# Patient Record
Sex: Male | Born: 1940
Health system: Southern US, Community
[De-identification: ages and names within clinical notes are randomized; demographics above are authoritative.]

## PROBLEM LIST (undated history)

## (undated) DIAGNOSIS — N289 Disorder of kidney and ureter, unspecified: Secondary | ICD-10-CM

## (undated) DIAGNOSIS — M1990 Unspecified osteoarthritis, unspecified site: Secondary | ICD-10-CM

## (undated) HISTORY — PX: JOINT REPLACEMENT: SHX530

---

## 2010-05-03 ENCOUNTER — Ambulatory Visit: Payer: Self-pay | Admitting: Unknown Physician Specialty

## 2010-05-31 DIAGNOSIS — I10 Essential (primary) hypertension: Secondary | ICD-10-CM | POA: Insufficient documentation

## 2010-06-03 DIAGNOSIS — D355 Benign neoplasm of carotid body: Secondary | ICD-10-CM | POA: Insufficient documentation

## 2010-09-19 DIAGNOSIS — C44519 Basal cell carcinoma of skin of other part of trunk: Secondary | ICD-10-CM | POA: Insufficient documentation

## 2011-06-09 DIAGNOSIS — D164 Benign neoplasm of bones of skull and face: Secondary | ICD-10-CM | POA: Insufficient documentation

## 2013-01-24 DIAGNOSIS — D447 Neoplasm of uncertain behavior of aortic body and other paraganglia: Secondary | ICD-10-CM | POA: Insufficient documentation

## 2013-06-06 DIAGNOSIS — E669 Obesity, unspecified: Secondary | ICD-10-CM | POA: Insufficient documentation

## 2013-06-06 DIAGNOSIS — E663 Overweight: Secondary | ICD-10-CM | POA: Insufficient documentation

## 2013-06-13 ENCOUNTER — Ambulatory Visit: Payer: Self-pay | Admitting: Unknown Physician Specialty

## 2013-07-30 DIAGNOSIS — G4733 Obstructive sleep apnea (adult) (pediatric): Secondary | ICD-10-CM | POA: Insufficient documentation

## 2013-09-08 DIAGNOSIS — M1710 Unilateral primary osteoarthritis, unspecified knee: Secondary | ICD-10-CM | POA: Insufficient documentation

## 2013-09-08 DIAGNOSIS — M179 Osteoarthritis of knee, unspecified: Secondary | ICD-10-CM | POA: Insufficient documentation

## 2013-11-01 DIAGNOSIS — Z91018 Allergy to other foods: Secondary | ICD-10-CM | POA: Insufficient documentation

## 2013-11-01 DIAGNOSIS — J309 Allergic rhinitis, unspecified: Secondary | ICD-10-CM | POA: Insufficient documentation

## 2013-12-27 DIAGNOSIS — M7540 Impingement syndrome of unspecified shoulder: Secondary | ICD-10-CM | POA: Insufficient documentation

## 2014-01-20 ENCOUNTER — Ambulatory Visit: Payer: Self-pay | Admitting: Unknown Physician Specialty

## 2014-05-30 DIAGNOSIS — Z96652 Presence of left artificial knee joint: Secondary | ICD-10-CM | POA: Insufficient documentation

## 2015-05-18 DIAGNOSIS — X32XXXA Exposure to sunlight, initial encounter: Secondary | ICD-10-CM | POA: Diagnosis not present

## 2015-05-18 DIAGNOSIS — L570 Actinic keratosis: Secondary | ICD-10-CM | POA: Diagnosis not present

## 2015-06-15 DIAGNOSIS — D225 Melanocytic nevi of trunk: Secondary | ICD-10-CM | POA: Diagnosis not present

## 2015-06-15 DIAGNOSIS — Z85820 Personal history of malignant melanoma of skin: Secondary | ICD-10-CM | POA: Diagnosis not present

## 2015-06-15 DIAGNOSIS — D2271 Melanocytic nevi of right lower limb, including hip: Secondary | ICD-10-CM | POA: Diagnosis not present

## 2015-06-15 DIAGNOSIS — X32XXXA Exposure to sunlight, initial encounter: Secondary | ICD-10-CM | POA: Diagnosis not present

## 2015-06-15 DIAGNOSIS — L570 Actinic keratosis: Secondary | ICD-10-CM | POA: Diagnosis not present

## 2015-06-15 DIAGNOSIS — Z85828 Personal history of other malignant neoplasm of skin: Secondary | ICD-10-CM | POA: Diagnosis not present

## 2015-09-19 DIAGNOSIS — N183 Chronic kidney disease, stage 3 (moderate): Secondary | ICD-10-CM | POA: Diagnosis not present

## 2015-09-19 DIAGNOSIS — E7800 Pure hypercholesterolemia, unspecified: Secondary | ICD-10-CM | POA: Diagnosis not present

## 2015-09-19 DIAGNOSIS — Z79899 Other long term (current) drug therapy: Secondary | ICD-10-CM | POA: Diagnosis not present

## 2015-09-19 DIAGNOSIS — Z1211 Encounter for screening for malignant neoplasm of colon: Secondary | ICD-10-CM | POA: Diagnosis not present

## 2015-09-19 DIAGNOSIS — I10 Essential (primary) hypertension: Secondary | ICD-10-CM | POA: Diagnosis not present

## 2015-09-19 DIAGNOSIS — E663 Overweight: Secondary | ICD-10-CM | POA: Diagnosis not present

## 2015-09-19 DIAGNOSIS — Z125 Encounter for screening for malignant neoplasm of prostate: Secondary | ICD-10-CM | POA: Diagnosis not present

## 2015-09-19 DIAGNOSIS — Z0000 Encounter for general adult medical examination without abnormal findings: Secondary | ICD-10-CM | POA: Diagnosis not present

## 2015-10-08 DIAGNOSIS — D1809 Hemangioma of other sites: Secondary | ICD-10-CM | POA: Diagnosis not present

## 2015-10-08 DIAGNOSIS — D1802 Hemangioma of intracranial structures: Secondary | ICD-10-CM | POA: Diagnosis not present

## 2015-10-08 DIAGNOSIS — D447 Neoplasm of uncertain behavior of aortic body and other paraganglia: Secondary | ICD-10-CM | POA: Diagnosis not present

## 2015-10-09 DIAGNOSIS — X32XXXA Exposure to sunlight, initial encounter: Secondary | ICD-10-CM | POA: Diagnosis not present

## 2015-10-09 DIAGNOSIS — D2271 Melanocytic nevi of right lower limb, including hip: Secondary | ICD-10-CM | POA: Diagnosis not present

## 2015-10-09 DIAGNOSIS — D225 Melanocytic nevi of trunk: Secondary | ICD-10-CM | POA: Diagnosis not present

## 2015-10-09 DIAGNOSIS — Z85828 Personal history of other malignant neoplasm of skin: Secondary | ICD-10-CM | POA: Diagnosis not present

## 2015-10-09 DIAGNOSIS — Z85820 Personal history of malignant melanoma of skin: Secondary | ICD-10-CM | POA: Diagnosis not present

## 2015-10-09 DIAGNOSIS — L570 Actinic keratosis: Secondary | ICD-10-CM | POA: Diagnosis not present

## 2015-11-15 DIAGNOSIS — N183 Chronic kidney disease, stage 3 unspecified: Secondary | ICD-10-CM | POA: Insufficient documentation

## 2015-12-07 DIAGNOSIS — N281 Cyst of kidney, acquired: Secondary | ICD-10-CM | POA: Diagnosis not present

## 2015-12-07 DIAGNOSIS — N183 Chronic kidney disease, stage 3 (moderate): Secondary | ICD-10-CM | POA: Diagnosis not present

## 2016-02-22 DIAGNOSIS — Z86010 Personal history of colonic polyps: Secondary | ICD-10-CM | POA: Diagnosis not present

## 2016-02-22 DIAGNOSIS — D369 Benign neoplasm, unspecified site: Secondary | ICD-10-CM | POA: Diagnosis not present

## 2016-02-22 DIAGNOSIS — D125 Benign neoplasm of sigmoid colon: Secondary | ICD-10-CM | POA: Diagnosis not present

## 2016-02-22 DIAGNOSIS — K5730 Diverticulosis of large intestine without perforation or abscess without bleeding: Secondary | ICD-10-CM | POA: Diagnosis not present

## 2016-02-22 DIAGNOSIS — G4733 Obstructive sleep apnea (adult) (pediatric): Secondary | ICD-10-CM | POA: Diagnosis not present

## 2016-02-22 DIAGNOSIS — Z85828 Personal history of other malignant neoplasm of skin: Secondary | ICD-10-CM | POA: Diagnosis not present

## 2016-02-22 DIAGNOSIS — Z1211 Encounter for screening for malignant neoplasm of colon: Secondary | ICD-10-CM | POA: Diagnosis not present

## 2016-03-06 DIAGNOSIS — Z85820 Personal history of malignant melanoma of skin: Secondary | ICD-10-CM | POA: Diagnosis not present

## 2016-03-06 DIAGNOSIS — D485 Neoplasm of uncertain behavior of skin: Secondary | ICD-10-CM | POA: Diagnosis not present

## 2016-03-06 DIAGNOSIS — L570 Actinic keratosis: Secondary | ICD-10-CM | POA: Diagnosis not present

## 2016-03-06 DIAGNOSIS — X32XXXA Exposure to sunlight, initial encounter: Secondary | ICD-10-CM | POA: Diagnosis not present

## 2016-03-06 DIAGNOSIS — C44329 Squamous cell carcinoma of skin of other parts of face: Secondary | ICD-10-CM | POA: Diagnosis not present

## 2016-03-06 DIAGNOSIS — Z85828 Personal history of other malignant neoplasm of skin: Secondary | ICD-10-CM | POA: Diagnosis not present

## 2016-03-06 DIAGNOSIS — D225 Melanocytic nevi of trunk: Secondary | ICD-10-CM | POA: Diagnosis not present

## 2016-03-06 DIAGNOSIS — D2271 Melanocytic nevi of right lower limb, including hip: Secondary | ICD-10-CM | POA: Diagnosis not present

## 2016-05-21 DIAGNOSIS — L905 Scar conditions and fibrosis of skin: Secondary | ICD-10-CM | POA: Diagnosis not present

## 2016-05-21 DIAGNOSIS — C44329 Squamous cell carcinoma of skin of other parts of face: Secondary | ICD-10-CM | POA: Diagnosis not present

## 2016-05-22 DIAGNOSIS — N183 Chronic kidney disease, stage 3 (moderate): Secondary | ICD-10-CM | POA: Diagnosis not present

## 2016-05-22 DIAGNOSIS — I10 Essential (primary) hypertension: Secondary | ICD-10-CM | POA: Diagnosis not present

## 2016-05-26 DIAGNOSIS — H2513 Age-related nuclear cataract, bilateral: Secondary | ICD-10-CM | POA: Diagnosis not present

## 2016-05-26 DIAGNOSIS — H524 Presbyopia: Secondary | ICD-10-CM | POA: Diagnosis not present

## 2016-05-26 DIAGNOSIS — H11153 Pinguecula, bilateral: Secondary | ICD-10-CM | POA: Diagnosis not present

## 2016-06-20 DIAGNOSIS — I10 Essential (primary) hypertension: Secondary | ICD-10-CM | POA: Diagnosis not present

## 2016-06-20 DIAGNOSIS — G4730 Sleep apnea, unspecified: Secondary | ICD-10-CM | POA: Diagnosis not present

## 2016-06-20 DIAGNOSIS — Z79899 Other long term (current) drug therapy: Secondary | ICD-10-CM | POA: Diagnosis not present

## 2016-06-20 DIAGNOSIS — Z136 Encounter for screening for cardiovascular disorders: Secondary | ICD-10-CM | POA: Diagnosis not present

## 2016-06-20 DIAGNOSIS — N181 Chronic kidney disease, stage 1: Secondary | ICD-10-CM | POA: Diagnosis not present

## 2016-06-20 DIAGNOSIS — Z125 Encounter for screening for malignant neoplasm of prostate: Secondary | ICD-10-CM | POA: Diagnosis not present

## 2016-06-20 DIAGNOSIS — Z0000 Encounter for general adult medical examination without abnormal findings: Secondary | ICD-10-CM | POA: Diagnosis not present

## 2016-06-20 DIAGNOSIS — I517 Cardiomegaly: Secondary | ICD-10-CM | POA: Diagnosis not present

## 2016-06-20 DIAGNOSIS — R001 Bradycardia, unspecified: Secondary | ICD-10-CM | POA: Diagnosis not present

## 2016-06-20 DIAGNOSIS — R739 Hyperglycemia, unspecified: Secondary | ICD-10-CM | POA: Diagnosis not present

## 2016-06-20 DIAGNOSIS — R7303 Prediabetes: Secondary | ICD-10-CM | POA: Diagnosis not present

## 2016-06-20 DIAGNOSIS — E7800 Pure hypercholesterolemia, unspecified: Secondary | ICD-10-CM | POA: Diagnosis not present

## 2016-08-20 DIAGNOSIS — D225 Melanocytic nevi of trunk: Secondary | ICD-10-CM | POA: Diagnosis not present

## 2016-08-20 DIAGNOSIS — X32XXXA Exposure to sunlight, initial encounter: Secondary | ICD-10-CM | POA: Diagnosis not present

## 2016-08-20 DIAGNOSIS — L570 Actinic keratosis: Secondary | ICD-10-CM | POA: Diagnosis not present

## 2016-08-20 DIAGNOSIS — C44519 Basal cell carcinoma of skin of other part of trunk: Secondary | ICD-10-CM | POA: Diagnosis not present

## 2016-08-20 DIAGNOSIS — D2261 Melanocytic nevi of right upper limb, including shoulder: Secondary | ICD-10-CM | POA: Diagnosis not present

## 2016-08-20 DIAGNOSIS — Z85828 Personal history of other malignant neoplasm of skin: Secondary | ICD-10-CM | POA: Diagnosis not present

## 2016-08-20 DIAGNOSIS — D045 Carcinoma in situ of skin of trunk: Secondary | ICD-10-CM | POA: Diagnosis not present

## 2016-08-20 DIAGNOSIS — Z85820 Personal history of malignant melanoma of skin: Secondary | ICD-10-CM | POA: Diagnosis not present

## 2016-08-20 DIAGNOSIS — D485 Neoplasm of uncertain behavior of skin: Secondary | ICD-10-CM | POA: Diagnosis not present

## 2016-09-23 DIAGNOSIS — D045 Carcinoma in situ of skin of trunk: Secondary | ICD-10-CM | POA: Diagnosis not present

## 2016-09-23 DIAGNOSIS — C44519 Basal cell carcinoma of skin of other part of trunk: Secondary | ICD-10-CM | POA: Diagnosis not present

## 2016-11-20 DIAGNOSIS — N183 Chronic kidney disease, stage 3 (moderate): Secondary | ICD-10-CM | POA: Diagnosis not present

## 2017-01-06 DIAGNOSIS — Z85828 Personal history of other malignant neoplasm of skin: Secondary | ICD-10-CM | POA: Diagnosis not present

## 2017-01-06 DIAGNOSIS — L538 Other specified erythematous conditions: Secondary | ICD-10-CM | POA: Diagnosis not present

## 2017-01-06 DIAGNOSIS — L820 Inflamed seborrheic keratosis: Secondary | ICD-10-CM | POA: Diagnosis not present

## 2017-01-06 DIAGNOSIS — L570 Actinic keratosis: Secondary | ICD-10-CM | POA: Diagnosis not present

## 2017-01-06 DIAGNOSIS — D045 Carcinoma in situ of skin of trunk: Secondary | ICD-10-CM | POA: Diagnosis not present

## 2017-01-06 DIAGNOSIS — D225 Melanocytic nevi of trunk: Secondary | ICD-10-CM | POA: Diagnosis not present

## 2017-01-06 DIAGNOSIS — D2261 Melanocytic nevi of right upper limb, including shoulder: Secondary | ICD-10-CM | POA: Diagnosis not present

## 2017-01-06 DIAGNOSIS — X32XXXA Exposure to sunlight, initial encounter: Secondary | ICD-10-CM | POA: Diagnosis not present

## 2017-01-06 DIAGNOSIS — D485 Neoplasm of uncertain behavior of skin: Secondary | ICD-10-CM | POA: Diagnosis not present

## 2017-01-06 DIAGNOSIS — Z85820 Personal history of malignant melanoma of skin: Secondary | ICD-10-CM | POA: Diagnosis not present

## 2017-03-06 DIAGNOSIS — D045 Carcinoma in situ of skin of trunk: Secondary | ICD-10-CM | POA: Diagnosis not present

## 2017-03-06 DIAGNOSIS — L910 Hypertrophic scar: Secondary | ICD-10-CM | POA: Diagnosis not present

## 2017-06-11 DIAGNOSIS — C44712 Basal cell carcinoma of skin of right lower limb, including hip: Secondary | ICD-10-CM | POA: Diagnosis not present

## 2017-06-11 DIAGNOSIS — Z09 Encounter for follow-up examination after completed treatment for conditions other than malignant neoplasm: Secondary | ICD-10-CM | POA: Diagnosis not present

## 2017-06-11 DIAGNOSIS — Z85828 Personal history of other malignant neoplasm of skin: Secondary | ICD-10-CM | POA: Diagnosis not present

## 2017-06-11 DIAGNOSIS — Z08 Encounter for follow-up examination after completed treatment for malignant neoplasm: Secondary | ICD-10-CM | POA: Diagnosis not present

## 2017-06-11 DIAGNOSIS — X32XXXA Exposure to sunlight, initial encounter: Secondary | ICD-10-CM | POA: Diagnosis not present

## 2017-06-11 DIAGNOSIS — L570 Actinic keratosis: Secondary | ICD-10-CM | POA: Diagnosis not present

## 2017-06-11 DIAGNOSIS — C44519 Basal cell carcinoma of skin of other part of trunk: Secondary | ICD-10-CM | POA: Diagnosis not present

## 2017-06-11 DIAGNOSIS — Z85820 Personal history of malignant melanoma of skin: Secondary | ICD-10-CM | POA: Diagnosis not present

## 2017-06-11 DIAGNOSIS — D485 Neoplasm of uncertain behavior of skin: Secondary | ICD-10-CM | POA: Diagnosis not present

## 2017-07-29 DIAGNOSIS — C44712 Basal cell carcinoma of skin of right lower limb, including hip: Secondary | ICD-10-CM | POA: Diagnosis not present

## 2017-07-29 DIAGNOSIS — L905 Scar conditions and fibrosis of skin: Secondary | ICD-10-CM | POA: Diagnosis not present

## 2017-07-29 DIAGNOSIS — C44519 Basal cell carcinoma of skin of other part of trunk: Secondary | ICD-10-CM | POA: Diagnosis not present

## 2017-07-30 DIAGNOSIS — N183 Chronic kidney disease, stage 3 (moderate): Secondary | ICD-10-CM | POA: Diagnosis not present

## 2017-07-30 DIAGNOSIS — D631 Anemia in chronic kidney disease: Secondary | ICD-10-CM | POA: Diagnosis not present

## 2017-08-19 DIAGNOSIS — J029 Acute pharyngitis, unspecified: Secondary | ICD-10-CM | POA: Diagnosis not present

## 2017-08-19 DIAGNOSIS — J069 Acute upper respiratory infection, unspecified: Secondary | ICD-10-CM | POA: Diagnosis not present

## 2017-08-31 DIAGNOSIS — I10 Essential (primary) hypertension: Secondary | ICD-10-CM | POA: Diagnosis not present

## 2017-08-31 DIAGNOSIS — N182 Chronic kidney disease, stage 2 (mild): Secondary | ICD-10-CM | POA: Diagnosis not present

## 2017-08-31 DIAGNOSIS — I129 Hypertensive chronic kidney disease with stage 1 through stage 4 chronic kidney disease, or unspecified chronic kidney disease: Secondary | ICD-10-CM | POA: Diagnosis not present

## 2017-08-31 DIAGNOSIS — G4730 Sleep apnea, unspecified: Secondary | ICD-10-CM | POA: Diagnosis not present

## 2017-08-31 DIAGNOSIS — E663 Overweight: Secondary | ICD-10-CM | POA: Diagnosis not present

## 2017-08-31 DIAGNOSIS — E786 Lipoprotein deficiency: Secondary | ICD-10-CM | POA: Diagnosis not present

## 2017-08-31 DIAGNOSIS — Z79899 Other long term (current) drug therapy: Secondary | ICD-10-CM | POA: Diagnosis not present

## 2017-08-31 DIAGNOSIS — Z125 Encounter for screening for malignant neoplasm of prostate: Secondary | ICD-10-CM | POA: Diagnosis not present

## 2017-08-31 DIAGNOSIS — Z0000 Encounter for general adult medical examination without abnormal findings: Secondary | ICD-10-CM | POA: Diagnosis not present

## 2017-08-31 DIAGNOSIS — E785 Hyperlipidemia, unspecified: Secondary | ICD-10-CM | POA: Diagnosis not present

## 2017-09-09 DIAGNOSIS — S61210A Laceration without foreign body of right index finger without damage to nail, initial encounter: Secondary | ICD-10-CM | POA: Diagnosis not present

## 2017-10-28 DIAGNOSIS — X32XXXA Exposure to sunlight, initial encounter: Secondary | ICD-10-CM | POA: Diagnosis not present

## 2017-10-28 DIAGNOSIS — Z08 Encounter for follow-up examination after completed treatment for malignant neoplasm: Secondary | ICD-10-CM | POA: Diagnosis not present

## 2017-10-28 DIAGNOSIS — Z85828 Personal history of other malignant neoplasm of skin: Secondary | ICD-10-CM | POA: Diagnosis not present

## 2017-10-28 DIAGNOSIS — D2261 Melanocytic nevi of right upper limb, including shoulder: Secondary | ICD-10-CM | POA: Diagnosis not present

## 2017-10-28 DIAGNOSIS — Z09 Encounter for follow-up examination after completed treatment for conditions other than malignant neoplasm: Secondary | ICD-10-CM | POA: Diagnosis not present

## 2017-10-28 DIAGNOSIS — Z85820 Personal history of malignant melanoma of skin: Secondary | ICD-10-CM | POA: Diagnosis not present

## 2017-10-28 DIAGNOSIS — L570 Actinic keratosis: Secondary | ICD-10-CM | POA: Diagnosis not present

## 2017-10-28 DIAGNOSIS — Z872 Personal history of diseases of the skin and subcutaneous tissue: Secondary | ICD-10-CM | POA: Diagnosis not present

## 2017-10-28 DIAGNOSIS — D2262 Melanocytic nevi of left upper limb, including shoulder: Secondary | ICD-10-CM | POA: Diagnosis not present

## 2017-11-23 DIAGNOSIS — D1802 Hemangioma of intracranial structures: Secondary | ICD-10-CM | POA: Diagnosis not present

## 2017-11-23 DIAGNOSIS — D447 Neoplasm of uncertain behavior of aortic body and other paraganglia: Secondary | ICD-10-CM | POA: Diagnosis not present

## 2018-01-05 ENCOUNTER — Emergency Department: Payer: PPO

## 2018-01-05 ENCOUNTER — Emergency Department
Admission: EM | Admit: 2018-01-05 | Discharge: 2018-01-05 | Disposition: A | Discharge status: Home / Self Care | Payer: PPO | Attending: Emergency Medicine | Admitting: Emergency Medicine

## 2018-01-05 ENCOUNTER — Encounter: Payer: Self-pay | Admitting: Emergency Medicine

## 2018-01-05 DIAGNOSIS — R509 Fever, unspecified: Secondary | ICD-10-CM | POA: Diagnosis present

## 2018-01-05 DIAGNOSIS — R05 Cough: Secondary | ICD-10-CM | POA: Diagnosis not present

## 2018-01-05 DIAGNOSIS — J189 Pneumonia, unspecified organism: Secondary | ICD-10-CM | POA: Diagnosis not present

## 2018-01-05 HISTORY — DX: Disorder of kidney and ureter, unspecified: N28.9

## 2018-01-05 HISTORY — DX: Unspecified osteoarthritis, unspecified site: M19.90

## 2018-01-05 LAB — CBC WITH DIFFERENTIAL/PLATELET
BASOS PCT: 1 %
Basophils Absolute: 0 10*3/uL (ref 0–0.1)
Eosinophils Absolute: 0 10*3/uL (ref 0–0.7)
Eosinophils Relative: 0 %
HCT: 38 % — ABNORMAL LOW (ref 40.0–52.0)
HEMOGLOBIN: 13.1 g/dL (ref 13.0–18.0)
LYMPHS ABS: 0.5 10*3/uL — AB (ref 1.0–3.6)
LYMPHS PCT: 6 %
MCH: 32.8 pg (ref 26.0–34.0)
MCHC: 34.3 g/dL (ref 32.0–36.0)
MCV: 95.5 fL (ref 80.0–100.0)
MONO ABS: 0.6 10*3/uL (ref 0.2–1.0)
Monocytes Relative: 7 %
NEUTROS PCT: 86 %
Neutro Abs: 8 10*3/uL — ABNORMAL HIGH (ref 1.4–6.5)
Platelets: 143 10*3/uL — ABNORMAL LOW (ref 150–440)
RBC: 3.98 MIL/uL — ABNORMAL LOW (ref 4.40–5.90)
RDW: 13.3 % (ref 11.5–14.5)
WBC: 9.2 10*3/uL (ref 3.8–10.6)

## 2018-01-05 LAB — COMPREHENSIVE METABOLIC PANEL
ALBUMIN: 4 g/dL (ref 3.5–5.0)
ALK PHOS: 106 U/L (ref 38–126)
ALT: 124 U/L — ABNORMAL HIGH (ref 0–44)
ANION GAP: 10 (ref 5–15)
AST: 137 U/L — ABNORMAL HIGH (ref 15–41)
BILIRUBIN TOTAL: 0.8 mg/dL (ref 0.3–1.2)
BUN: 26 mg/dL — ABNORMAL HIGH (ref 8–23)
CALCIUM: 9.1 mg/dL (ref 8.9–10.3)
CO2: 24 mmol/L (ref 22–32)
Chloride: 103 mmol/L (ref 98–111)
Creatinine, Ser: 1.9 mg/dL — ABNORMAL HIGH (ref 0.61–1.24)
GFR calc Af Amer: 38 mL/min — ABNORMAL LOW (ref >60)
GFR, EST NON AFRICAN AMERICAN: 33 mL/min — AB (ref >60)
GLUCOSE: 140 mg/dL — AB (ref 70–99)
Potassium: 3.5 mmol/L (ref 3.5–5.1)
Sodium: 137 mmol/L (ref 135–145)
Total Protein: 7 g/dL (ref 6.5–8.1)

## 2018-01-05 MED ORDER — CEPHALEXIN 250 MG PO CAPS: 250.0000 mg | ORAL_CAPSULE | Freq: Three times a day (TID) | ORAL | 0 refills | Status: AC

## 2018-01-05 MED ORDER — ACETAMINOPHEN 500 MG PO TABS
1000.0000 mg | ORAL_TABLET | Freq: Once | ORAL | Status: AC
  Administered 2018-01-05: 1000 mg via ORAL

## 2018-01-05 MED ORDER — ACETAMINOPHEN 500 MG PO TABS
ORAL_TABLET | ORAL | Status: AC
  Filled 2018-01-05: qty 2

## 2018-01-05 MED ORDER — SODIUM CHLORIDE 0.9 % IV BOLUS
1000.0000 mL | Freq: Once | INTRAVENOUS | Status: AC
  Administered 2018-01-05: 1000 mL via INTRAVENOUS

## 2018-01-05 MED ORDER — AZITHROMYCIN 500 MG PO TABS
500.0000 mg | ORAL_TABLET | Freq: Once | ORAL | Status: AC
  Administered 2018-01-05: 500 mg via ORAL
  Filled 2018-01-05: qty 1

## 2018-01-05 MED ORDER — SODIUM CHLORIDE 0.9 % IV SOLN
1.0000 g | Freq: Once | INTRAVENOUS | Status: AC
  Administered 2018-01-05: 1 g via INTRAVENOUS
  Filled 2018-01-05: qty 10

## 2018-01-05 MED ORDER — AZITHROMYCIN 250 MG PO TABS: 250.0000 mg | ORAL_TABLET | Freq: Every day | ORAL | 0 refills | Status: DC

## 2018-01-05 NOTE — ED Provider Notes (Signed)
[facility] Emergency Department Provider Note  Time seen: 5:39 PM  I have reviewed the triage vital signs and the nursing notes.   HISTORY  Chief Complaint Fever    HPI Adrian Reyes is a 77 y.o. male with a past medical history of arthritis, CKD, presents to the emergency department for weakness chills fever and cough.  According to the patient for the past 2 days he has been coughing with generalized fatigue and weakness.  States he has been using Tylenol every 6 hours for fever.  States today the chills has significantly worsened when he came to the emergency department for evaluation.  Denies any chest pain.  Denies abdominal pain does state intermittent loose stool.  Denies any vomiting.  Denies any dysuria or hematuria.   Past Medical History:  Diagnosis Date  . Arthritis   . Renal disorder     There are no active problems to display for this patient.   Past Surgical History:  Procedure Laterality Date  . JOINT REPLACEMENT      Prior to Admission medications   Not on File    Not on File  No family history on file.  Social History Social History   Tobacco Use  . Smoking status: Never Smoker  . Smokeless tobacco: Never Used  Substance Use Topics  . Alcohol use: Yes  . Drug use: Not on file    Review of Systems Constitutional: Positive for fever and chills.  Positive for weakness. ENT: Negative for recent illness/congestion Cardiovascular: Negative for chest pain. Respiratory: Negative for shortness of breath.  Frequent cough. Gastrointestinal: Negative for abdominal pain, vomiting Genitourinary: Negative for urinary compaints Musculoskeletal: Negative for leg pain or swelling. Skin: Negative for skin complaints  Neurological: Negative for headache All other ROS negative  ____________________________________________   PHYSICAL EXAM:  VITAL SIGNS: ED Triage Vitals  Enc Vitals Group     BP 01/05/18 1600 (!) 108/55     Pulse Rate 01/05/18 1600 72     Resp 01/05/18 1600 18     Temp 01/05/18 1600 (!) 101.6 F (38.7 C)     Temp Source 01/05/18 1600 Oral     SpO2 01/05/18 1600 95 %     Weight 01/05/18 1603 220 lb (99.8 kg)     Height 01/05/18 1603 5\' 9"  (1.753 m)     Head Circumference --      Peak Flow --      Pain Score 01/05/18 1601 0     Pain Loc --      Pain Edu? --      Excl. in [place]? --     Constitutional: Alert and oriented. Well appearing and in no distress. Eyes: Normal exam ENT   Head: Normocephalic and atraumatic.   Mouth/Throat: Mucous membranes are moist. Cardiovascular: Normal rate, regular rhythm. No murmur Respiratory: Normal respiratory effort without tachypnea nor retractions. Breath sounds are clear, but frequent cough with deep inspiration attempts. Gastrointestinal: Soft and nontender. No distention.   Musculoskeletal: Nontender with normal range of motion in all extremities.  Neurologic:  Normal speech and language. No gross focal neurologic deficits are appreciated. Skin:  Skin is warm, dry and intact.  Psychiatric: Mood and affect are normal.  ____________________________________________   RADIOLOGY  Chest x-ray shows lingular opacity consistent with pneumonia.  ____________________________________________   INITIAL IMPRESSION / ASSESSMENT AND PLAN / ED COURSE  Pertinent labs & imaging results that were available during my care of the patient were reviewed by  me and considered in my medical decision making (see chart for details).  Patient presents emergency department for fever cough weakness and chills.  Differential would include upper respiratory infection, pneumonia, other infectious etiology, influenza.  We will check labs, chest x-ray and continue to closely monitor.  Chest x-ray shows lingular pneumonia, labs are largely at baseline as for his kidney dysfunction however patient does have mildly elevated LFTs.  Has a completely benign abdominal exam,  nontender.  Given the patient's cough with pneumonia confirmed on chest x-ray I highly believe this to be the cause of the patient's fever.  We will treat with antibiotics.  While the patient is in the emergency department we will dose IV fluids as well as IV antibiotics.  Patient's temperature currently 99.6 heart rate of 65 reassuring blood pressure and vitals satting between 96 to 98% on room air.  ____________________________________________   FINAL CLINICAL IMPRESSION(S) / ED DIAGNOSES  Pneumonia    Harvest Dark, MD 01/05/18 1743

## 2018-01-05 NOTE — ED Triage Notes (Signed)
Pt arrived with complaints of fever, cough, chills, and nausea.Pt denies pain stating, "I just feel weak." Pt denies any chest pain or shortness of breath. Pt states symptoms started Sunday night and has attempted OTC medication regimen. Last dosage of tylenol 1415

## 2018-01-05 NOTE — ED Notes (Signed)
Pt denies any burning or pain with urination, but states he has been urinating only small amounts at a time. Pt believes this is due to not drinking enough over the last few days.

## 2018-02-09 DIAGNOSIS — H903 Sensorineural hearing loss, bilateral: Secondary | ICD-10-CM | POA: Diagnosis not present

## 2018-02-12 DIAGNOSIS — M79671 Pain in right foot: Secondary | ICD-10-CM | POA: Diagnosis not present

## 2018-02-12 DIAGNOSIS — M2011 Hallux valgus (acquired), right foot: Secondary | ICD-10-CM | POA: Diagnosis not present

## 2018-03-11 DIAGNOSIS — N183 Chronic kidney disease, stage 3 (moderate): Secondary | ICD-10-CM | POA: Diagnosis not present

## 2018-03-11 DIAGNOSIS — H11153 Pinguecula, bilateral: Secondary | ICD-10-CM | POA: Diagnosis not present

## 2018-03-11 DIAGNOSIS — H25813 Combined forms of age-related cataract, bilateral: Secondary | ICD-10-CM | POA: Diagnosis not present

## 2018-03-11 DIAGNOSIS — H524 Presbyopia: Secondary | ICD-10-CM | POA: Diagnosis not present

## 2018-03-12 DIAGNOSIS — M2011 Hallux valgus (acquired), right foot: Secondary | ICD-10-CM | POA: Diagnosis not present

## 2018-03-15 DIAGNOSIS — H11153 Pinguecula, bilateral: Secondary | ICD-10-CM | POA: Diagnosis not present

## 2018-03-15 DIAGNOSIS — H25813 Combined forms of age-related cataract, bilateral: Secondary | ICD-10-CM | POA: Diagnosis not present

## 2018-03-15 DIAGNOSIS — H524 Presbyopia: Secondary | ICD-10-CM | POA: Diagnosis not present

## 2018-04-06 DIAGNOSIS — M24174 Other articular cartilage disorders, right foot: Secondary | ICD-10-CM | POA: Diagnosis not present

## 2018-04-06 DIAGNOSIS — M21611 Bunion of right foot: Secondary | ICD-10-CM | POA: Diagnosis not present

## 2018-04-06 DIAGNOSIS — S93141A Subluxation of metatarsophalangeal joint of right great toe, initial encounter: Secondary | ICD-10-CM | POA: Diagnosis not present

## 2018-04-06 DIAGNOSIS — M2011 Hallux valgus (acquired), right foot: Secondary | ICD-10-CM | POA: Diagnosis not present

## 2018-04-06 DIAGNOSIS — M216X1 Other acquired deformities of right foot: Secondary | ICD-10-CM | POA: Diagnosis not present

## 2018-04-06 DIAGNOSIS — S93144A Subluxation of metatarsophalangeal joint of right lesser toe(s), initial encounter: Secondary | ICD-10-CM | POA: Diagnosis not present

## 2018-04-06 DIAGNOSIS — M2041 Other hammer toe(s) (acquired), right foot: Secondary | ICD-10-CM | POA: Diagnosis not present

## 2018-04-06 DIAGNOSIS — G8918 Other acute postprocedural pain: Secondary | ICD-10-CM | POA: Diagnosis not present

## 2018-04-19 DIAGNOSIS — M2011 Hallux valgus (acquired), right foot: Secondary | ICD-10-CM | POA: Diagnosis not present

## 2018-05-06 DIAGNOSIS — D2261 Melanocytic nevi of right upper limb, including shoulder: Secondary | ICD-10-CM | POA: Diagnosis not present

## 2018-05-06 DIAGNOSIS — M67442 Ganglion, left hand: Secondary | ICD-10-CM | POA: Diagnosis not present

## 2018-05-06 DIAGNOSIS — D485 Neoplasm of uncertain behavior of skin: Secondary | ICD-10-CM | POA: Diagnosis not present

## 2018-05-06 DIAGNOSIS — L308 Other specified dermatitis: Secondary | ICD-10-CM | POA: Diagnosis not present

## 2018-05-06 DIAGNOSIS — X32XXXA Exposure to sunlight, initial encounter: Secondary | ICD-10-CM | POA: Diagnosis not present

## 2018-05-06 DIAGNOSIS — Z85820 Personal history of malignant melanoma of skin: Secondary | ICD-10-CM | POA: Diagnosis not present

## 2018-05-06 DIAGNOSIS — R202 Paresthesia of skin: Secondary | ICD-10-CM | POA: Diagnosis not present

## 2018-05-06 DIAGNOSIS — C44719 Basal cell carcinoma of skin of left lower limb, including hip: Secondary | ICD-10-CM | POA: Diagnosis not present

## 2018-05-06 DIAGNOSIS — Z85828 Personal history of other malignant neoplasm of skin: Secondary | ICD-10-CM | POA: Diagnosis not present

## 2018-05-06 DIAGNOSIS — L570 Actinic keratosis: Secondary | ICD-10-CM | POA: Diagnosis not present

## 2018-05-07 DIAGNOSIS — M2011 Hallux valgus (acquired), right foot: Secondary | ICD-10-CM | POA: Diagnosis not present

## 2018-05-19 DIAGNOSIS — M2011 Hallux valgus (acquired), right foot: Secondary | ICD-10-CM | POA: Diagnosis not present

## 2018-06-02 DIAGNOSIS — C44719 Basal cell carcinoma of skin of left lower limb, including hip: Secondary | ICD-10-CM | POA: Diagnosis not present

## 2018-06-16 DIAGNOSIS — M2011 Hallux valgus (acquired), right foot: Secondary | ICD-10-CM | POA: Diagnosis not present

## 2018-09-16 DIAGNOSIS — I10 Essential (primary) hypertension: Secondary | ICD-10-CM | POA: Diagnosis not present

## 2018-09-16 DIAGNOSIS — N183 Chronic kidney disease, stage 3 (moderate): Secondary | ICD-10-CM | POA: Diagnosis not present

## 2018-10-04 DIAGNOSIS — D485 Neoplasm of uncertain behavior of skin: Secondary | ICD-10-CM | POA: Diagnosis not present

## 2018-10-04 DIAGNOSIS — L298 Other pruritus: Secondary | ICD-10-CM | POA: Diagnosis not present

## 2018-10-04 DIAGNOSIS — Z85828 Personal history of other malignant neoplasm of skin: Secondary | ICD-10-CM | POA: Diagnosis not present

## 2018-10-04 DIAGNOSIS — L910 Hypertrophic scar: Secondary | ICD-10-CM | POA: Diagnosis not present

## 2018-10-04 DIAGNOSIS — Z85820 Personal history of malignant melanoma of skin: Secondary | ICD-10-CM | POA: Diagnosis not present

## 2018-10-04 DIAGNOSIS — L570 Actinic keratosis: Secondary | ICD-10-CM | POA: Diagnosis not present

## 2018-10-04 DIAGNOSIS — Z872 Personal history of diseases of the skin and subcutaneous tissue: Secondary | ICD-10-CM | POA: Diagnosis not present

## 2018-10-04 DIAGNOSIS — X32XXXA Exposure to sunlight, initial encounter: Secondary | ICD-10-CM | POA: Diagnosis not present

## 2018-10-04 DIAGNOSIS — D2272 Melanocytic nevi of left lower limb, including hip: Secondary | ICD-10-CM | POA: Diagnosis not present

## 2018-10-04 DIAGNOSIS — D2262 Melanocytic nevi of left upper limb, including shoulder: Secondary | ICD-10-CM | POA: Diagnosis not present

## 2018-10-07 DIAGNOSIS — D126 Benign neoplasm of colon, unspecified: Secondary | ICD-10-CM | POA: Diagnosis not present

## 2018-10-07 DIAGNOSIS — Z0000 Encounter for general adult medical examination without abnormal findings: Secondary | ICD-10-CM | POA: Diagnosis not present

## 2018-10-07 DIAGNOSIS — I10 Essential (primary) hypertension: Secondary | ICD-10-CM | POA: Diagnosis not present

## 2018-10-07 DIAGNOSIS — G4730 Sleep apnea, unspecified: Secondary | ICD-10-CM | POA: Diagnosis not present

## 2018-10-07 DIAGNOSIS — E663 Overweight: Secondary | ICD-10-CM | POA: Diagnosis not present

## 2018-10-07 DIAGNOSIS — N183 Chronic kidney disease, stage 3 (moderate): Secondary | ICD-10-CM | POA: Diagnosis not present

## 2018-10-07 DIAGNOSIS — Z125 Encounter for screening for malignant neoplasm of prostate: Secondary | ICD-10-CM | POA: Diagnosis not present

## 2018-10-14 DIAGNOSIS — L570 Actinic keratosis: Secondary | ICD-10-CM | POA: Diagnosis not present

## 2018-11-19 DIAGNOSIS — M7989 Other specified soft tissue disorders: Secondary | ICD-10-CM | POA: Diagnosis not present

## 2018-11-19 DIAGNOSIS — I82811 Embolism and thrombosis of superficial veins of right lower extremities: Secondary | ICD-10-CM | POA: Diagnosis not present

## 2018-11-19 DIAGNOSIS — M79661 Pain in right lower leg: Secondary | ICD-10-CM | POA: Diagnosis not present

## 2018-11-22 DIAGNOSIS — D1802 Hemangioma of intracranial structures: Secondary | ICD-10-CM | POA: Diagnosis not present

## 2018-11-22 DIAGNOSIS — D447 Neoplasm of uncertain behavior of aortic body and other paraganglia: Secondary | ICD-10-CM | POA: Diagnosis not present

## 2018-11-29 DIAGNOSIS — I82811 Embolism and thrombosis of superficial veins of right lower extremities: Secondary | ICD-10-CM | POA: Diagnosis not present

## 2018-11-29 DIAGNOSIS — M7989 Other specified soft tissue disorders: Secondary | ICD-10-CM | POA: Diagnosis not present

## 2018-12-06 DIAGNOSIS — H93A9 Pulsatile tinnitus, unspecified ear: Secondary | ICD-10-CM | POA: Diagnosis not present

## 2018-12-06 DIAGNOSIS — D447 Neoplasm of uncertain behavior of aortic body and other paraganglia: Secondary | ICD-10-CM | POA: Diagnosis not present

## 2018-12-09 DIAGNOSIS — M5440 Lumbago with sciatica, unspecified side: Secondary | ICD-10-CM | POA: Diagnosis not present

## 2018-12-09 DIAGNOSIS — E7800 Pure hypercholesterolemia, unspecified: Secondary | ICD-10-CM | POA: Diagnosis not present

## 2018-12-09 DIAGNOSIS — M4316 Spondylolisthesis, lumbar region: Secondary | ICD-10-CM | POA: Diagnosis not present

## 2018-12-09 DIAGNOSIS — M7061 Trochanteric bursitis, right hip: Secondary | ICD-10-CM | POA: Diagnosis not present

## 2019-01-11 DIAGNOSIS — Z01818 Encounter for other preprocedural examination: Secondary | ICD-10-CM | POA: Diagnosis not present

## 2019-01-11 DIAGNOSIS — Z20828 Contact with and (suspected) exposure to other viral communicable diseases: Secondary | ICD-10-CM | POA: Diagnosis not present

## 2019-02-24 DIAGNOSIS — N1832 Chronic kidney disease, stage 3b: Secondary | ICD-10-CM | POA: Diagnosis not present

## 2019-03-14 DIAGNOSIS — Z20828 Contact with and (suspected) exposure to other viral communicable diseases: Secondary | ICD-10-CM | POA: Diagnosis not present

## 2019-03-14 DIAGNOSIS — Z1211 Encounter for screening for malignant neoplasm of colon: Secondary | ICD-10-CM | POA: Diagnosis not present

## 2019-03-17 DIAGNOSIS — K644 Residual hemorrhoidal skin tags: Secondary | ICD-10-CM | POA: Diagnosis not present

## 2019-03-17 DIAGNOSIS — Z1211 Encounter for screening for malignant neoplasm of colon: Secondary | ICD-10-CM | POA: Diagnosis not present

## 2019-03-17 DIAGNOSIS — K5730 Diverticulosis of large intestine without perforation or abscess without bleeding: Secondary | ICD-10-CM | POA: Diagnosis not present

## 2019-03-17 DIAGNOSIS — Z86010 Personal history of colonic polyps: Secondary | ICD-10-CM | POA: Diagnosis not present

## 2019-03-28 DIAGNOSIS — H25813 Combined forms of age-related cataract, bilateral: Secondary | ICD-10-CM | POA: Diagnosis not present

## 2019-04-01 DIAGNOSIS — X32XXXA Exposure to sunlight, initial encounter: Secondary | ICD-10-CM | POA: Diagnosis not present

## 2019-04-01 DIAGNOSIS — D2262 Melanocytic nevi of left upper limb, including shoulder: Secondary | ICD-10-CM | POA: Diagnosis not present

## 2019-04-01 DIAGNOSIS — Z85828 Personal history of other malignant neoplasm of skin: Secondary | ICD-10-CM | POA: Diagnosis not present

## 2019-04-01 DIAGNOSIS — D2272 Melanocytic nevi of left lower limb, including hip: Secondary | ICD-10-CM | POA: Diagnosis not present

## 2019-04-01 DIAGNOSIS — D485 Neoplasm of uncertain behavior of skin: Secondary | ICD-10-CM | POA: Diagnosis not present

## 2019-04-01 DIAGNOSIS — Z85820 Personal history of malignant melanoma of skin: Secondary | ICD-10-CM | POA: Diagnosis not present

## 2019-04-01 DIAGNOSIS — R202 Paresthesia of skin: Secondary | ICD-10-CM | POA: Diagnosis not present

## 2019-04-01 DIAGNOSIS — C44519 Basal cell carcinoma of skin of other part of trunk: Secondary | ICD-10-CM | POA: Diagnosis not present

## 2019-04-01 DIAGNOSIS — L570 Actinic keratosis: Secondary | ICD-10-CM | POA: Diagnosis not present

## 2019-04-01 DIAGNOSIS — D225 Melanocytic nevi of trunk: Secondary | ICD-10-CM | POA: Diagnosis not present

## 2019-04-01 DIAGNOSIS — C44619 Basal cell carcinoma of skin of left upper limb, including shoulder: Secondary | ICD-10-CM | POA: Diagnosis not present

## 2019-04-09 DIAGNOSIS — H25813 Combined forms of age-related cataract, bilateral: Secondary | ICD-10-CM | POA: Diagnosis not present

## 2019-04-09 DIAGNOSIS — Z20828 Contact with and (suspected) exposure to other viral communicable diseases: Secondary | ICD-10-CM | POA: Diagnosis not present

## 2019-04-11 DIAGNOSIS — H52222 Regular astigmatism, left eye: Secondary | ICD-10-CM | POA: Diagnosis not present

## 2019-04-11 DIAGNOSIS — H25812 Combined forms of age-related cataract, left eye: Secondary | ICD-10-CM | POA: Diagnosis not present

## 2019-05-02 DIAGNOSIS — M7061 Trochanteric bursitis, right hip: Secondary | ICD-10-CM | POA: Diagnosis not present

## 2019-05-05 DIAGNOSIS — C44619 Basal cell carcinoma of skin of left upper limb, including shoulder: Secondary | ICD-10-CM | POA: Diagnosis not present

## 2019-05-07 DIAGNOSIS — H25813 Combined forms of age-related cataract, bilateral: Secondary | ICD-10-CM | POA: Diagnosis not present

## 2019-05-07 DIAGNOSIS — Z20828 Contact with and (suspected) exposure to other viral communicable diseases: Secondary | ICD-10-CM | POA: Diagnosis not present

## 2019-05-09 DIAGNOSIS — H25811 Combined forms of age-related cataract, right eye: Secondary | ICD-10-CM | POA: Diagnosis not present

## 2019-05-09 DIAGNOSIS — H52221 Regular astigmatism, right eye: Secondary | ICD-10-CM | POA: Diagnosis not present

## 2019-05-19 DIAGNOSIS — C44519 Basal cell carcinoma of skin of other part of trunk: Secondary | ICD-10-CM | POA: Diagnosis not present

## 2019-05-24 DIAGNOSIS — M25551 Pain in right hip: Secondary | ICD-10-CM | POA: Diagnosis not present

## 2019-05-31 DIAGNOSIS — M25551 Pain in right hip: Secondary | ICD-10-CM | POA: Diagnosis not present

## 2019-06-28 DIAGNOSIS — M25551 Pain in right hip: Secondary | ICD-10-CM | POA: Diagnosis not present

## 2019-07-21 DIAGNOSIS — N1832 Chronic kidney disease, stage 3b: Secondary | ICD-10-CM | POA: Diagnosis not present

## 2019-07-21 DIAGNOSIS — I10 Essential (primary) hypertension: Secondary | ICD-10-CM | POA: Diagnosis not present

## 2019-07-21 DIAGNOSIS — Z86718 Personal history of other venous thrombosis and embolism: Secondary | ICD-10-CM | POA: Insufficient documentation

## 2019-09-02 DIAGNOSIS — M25551 Pain in right hip: Secondary | ICD-10-CM | POA: Diagnosis not present

## 2019-09-02 DIAGNOSIS — M7061 Trochanteric bursitis, right hip: Secondary | ICD-10-CM | POA: Diagnosis not present

## 2019-09-12 ENCOUNTER — Other Ambulatory Visit: Payer: Self-pay | Admitting: Surgery

## 2019-09-12 DIAGNOSIS — M7061 Trochanteric bursitis, right hip: Secondary | ICD-10-CM

## 2019-09-19 DIAGNOSIS — L570 Actinic keratosis: Secondary | ICD-10-CM | POA: Diagnosis not present

## 2019-09-19 DIAGNOSIS — Z85828 Personal history of other malignant neoplasm of skin: Secondary | ICD-10-CM | POA: Diagnosis not present

## 2019-09-19 DIAGNOSIS — X32XXXA Exposure to sunlight, initial encounter: Secondary | ICD-10-CM | POA: Diagnosis not present

## 2019-09-19 DIAGNOSIS — L237 Allergic contact dermatitis due to plants, except food: Secondary | ICD-10-CM | POA: Diagnosis not present

## 2019-09-19 DIAGNOSIS — Z85820 Personal history of malignant melanoma of skin: Secondary | ICD-10-CM | POA: Diagnosis not present

## 2019-09-19 DIAGNOSIS — D225 Melanocytic nevi of trunk: Secondary | ICD-10-CM | POA: Diagnosis not present

## 2019-09-19 DIAGNOSIS — D2271 Melanocytic nevi of right lower limb, including hip: Secondary | ICD-10-CM | POA: Diagnosis not present

## 2019-09-19 DIAGNOSIS — C44519 Basal cell carcinoma of skin of other part of trunk: Secondary | ICD-10-CM | POA: Diagnosis not present

## 2019-09-19 DIAGNOSIS — D2261 Melanocytic nevi of right upper limb, including shoulder: Secondary | ICD-10-CM | POA: Diagnosis not present

## 2019-09-19 DIAGNOSIS — D485 Neoplasm of uncertain behavior of skin: Secondary | ICD-10-CM | POA: Diagnosis not present

## 2019-09-29 ENCOUNTER — Ambulatory Visit: Payer: PPO

## 2019-09-29 ENCOUNTER — Other Ambulatory Visit: Payer: Self-pay

## 2019-09-29 ENCOUNTER — Ambulatory Visit
Admission: RE | Admit: 2019-09-29 | Discharge: 2019-09-29 | Disposition: A | Discharge status: Home / Self Care | Payer: PPO | Source: Ambulatory Visit | Attending: Surgery | Admitting: Surgery

## 2019-09-29 DIAGNOSIS — M7061 Trochanteric bursitis, right hip: Secondary | ICD-10-CM | POA: Insufficient documentation

## 2019-09-29 DIAGNOSIS — M1611 Unilateral primary osteoarthritis, right hip: Secondary | ICD-10-CM | POA: Diagnosis not present

## 2019-10-06 DIAGNOSIS — C44519 Basal cell carcinoma of skin of other part of trunk: Secondary | ICD-10-CM | POA: Diagnosis not present

## 2019-10-06 DIAGNOSIS — H903 Sensorineural hearing loss, bilateral: Secondary | ICD-10-CM | POA: Diagnosis not present

## 2019-10-07 DIAGNOSIS — M76891 Other specified enthesopathies of right lower limb, excluding foot: Secondary | ICD-10-CM | POA: Insufficient documentation

## 2019-10-07 DIAGNOSIS — M7061 Trochanteric bursitis, right hip: Secondary | ICD-10-CM | POA: Diagnosis not present

## 2019-11-01 DIAGNOSIS — M25551 Pain in right hip: Secondary | ICD-10-CM | POA: Diagnosis not present

## 2019-11-07 DIAGNOSIS — Z87448 Personal history of other diseases of urinary system: Secondary | ICD-10-CM | POA: Diagnosis not present

## 2019-11-07 DIAGNOSIS — I10 Essential (primary) hypertension: Secondary | ICD-10-CM | POA: Diagnosis not present

## 2019-11-07 DIAGNOSIS — E663 Overweight: Secondary | ICD-10-CM | POA: Diagnosis not present

## 2019-11-07 DIAGNOSIS — Z125 Encounter for screening for malignant neoplasm of prostate: Secondary | ICD-10-CM | POA: Diagnosis not present

## 2019-11-07 DIAGNOSIS — Z0000 Encounter for general adult medical examination without abnormal findings: Secondary | ICD-10-CM | POA: Diagnosis not present

## 2019-11-07 DIAGNOSIS — E782 Mixed hyperlipidemia: Secondary | ICD-10-CM | POA: Diagnosis not present

## 2019-12-07 DIAGNOSIS — Z20822 Contact with and (suspected) exposure to covid-19: Secondary | ICD-10-CM | POA: Diagnosis not present

## 2019-12-07 DIAGNOSIS — Z03818 Encounter for observation for suspected exposure to other biological agents ruled out: Secondary | ICD-10-CM | POA: Diagnosis not present

## 2019-12-07 DIAGNOSIS — U071 COVID-19: Secondary | ICD-10-CM | POA: Diagnosis not present

## 2019-12-18 DIAGNOSIS — Z20822 Contact with and (suspected) exposure to covid-19: Secondary | ICD-10-CM | POA: Diagnosis not present

## 2019-12-22 DIAGNOSIS — M76891 Other specified enthesopathies of right lower limb, excluding foot: Secondary | ICD-10-CM | POA: Diagnosis not present

## 2019-12-22 DIAGNOSIS — M25551 Pain in right hip: Secondary | ICD-10-CM | POA: Diagnosis not present

## 2019-12-22 DIAGNOSIS — M7061 Trochanteric bursitis, right hip: Secondary | ICD-10-CM | POA: Diagnosis not present

## 2019-12-22 DIAGNOSIS — S76019S Strain of muscle, fascia and tendon of unspecified hip, sequela: Secondary | ICD-10-CM | POA: Diagnosis not present

## 2019-12-23 DIAGNOSIS — D1802 Hemangioma of intracranial structures: Secondary | ICD-10-CM | POA: Diagnosis not present

## 2019-12-23 DIAGNOSIS — H93A2 Pulsatile tinnitus, left ear: Secondary | ICD-10-CM | POA: Diagnosis not present

## 2019-12-23 DIAGNOSIS — D447 Neoplasm of uncertain behavior of aortic body and other paraganglia: Secondary | ICD-10-CM | POA: Diagnosis not present

## 2020-01-26 DIAGNOSIS — N1832 Chronic kidney disease, stage 3b: Secondary | ICD-10-CM | POA: Diagnosis not present

## 2020-01-26 DIAGNOSIS — I10 Essential (primary) hypertension: Secondary | ICD-10-CM | POA: Diagnosis not present

## 2020-03-14 ENCOUNTER — Other Ambulatory Visit: Payer: Self-pay | Admitting: Sports Medicine

## 2020-03-14 DIAGNOSIS — G8929 Other chronic pain: Secondary | ICD-10-CM | POA: Diagnosis not present

## 2020-03-14 DIAGNOSIS — M25551 Pain in right hip: Secondary | ICD-10-CM | POA: Diagnosis not present

## 2020-03-14 DIAGNOSIS — M76891 Other specified enthesopathies of right lower limb, excluding foot: Secondary | ICD-10-CM | POA: Diagnosis not present

## 2020-03-14 DIAGNOSIS — M1711 Unilateral primary osteoarthritis, right knee: Secondary | ICD-10-CM | POA: Diagnosis not present

## 2020-03-14 DIAGNOSIS — S76019S Strain of muscle, fascia and tendon of unspecified hip, sequela: Secondary | ICD-10-CM | POA: Diagnosis not present

## 2020-03-14 DIAGNOSIS — M25561 Pain in right knee: Secondary | ICD-10-CM | POA: Diagnosis not present

## 2020-03-15 DIAGNOSIS — L814 Other melanin hyperpigmentation: Secondary | ICD-10-CM | POA: Diagnosis not present

## 2020-03-15 DIAGNOSIS — D485 Neoplasm of uncertain behavior of skin: Secondary | ICD-10-CM | POA: Diagnosis not present

## 2020-03-15 DIAGNOSIS — R208 Other disturbances of skin sensation: Secondary | ICD-10-CM | POA: Diagnosis not present

## 2020-03-15 DIAGNOSIS — L578 Other skin changes due to chronic exposure to nonionizing radiation: Secondary | ICD-10-CM | POA: Diagnosis not present

## 2020-03-15 DIAGNOSIS — C4442 Squamous cell carcinoma of skin of scalp and neck: Secondary | ICD-10-CM | POA: Diagnosis not present

## 2020-03-15 DIAGNOSIS — Z85828 Personal history of other malignant neoplasm of skin: Secondary | ICD-10-CM | POA: Diagnosis not present

## 2020-03-15 DIAGNOSIS — Z85820 Personal history of malignant melanoma of skin: Secondary | ICD-10-CM | POA: Diagnosis not present

## 2020-03-15 DIAGNOSIS — D2271 Melanocytic nevi of right lower limb, including hip: Secondary | ICD-10-CM | POA: Diagnosis not present

## 2020-03-15 DIAGNOSIS — D225 Melanocytic nevi of trunk: Secondary | ICD-10-CM | POA: Diagnosis not present

## 2020-03-15 DIAGNOSIS — L570 Actinic keratosis: Secondary | ICD-10-CM | POA: Diagnosis not present

## 2020-03-15 DIAGNOSIS — D2262 Melanocytic nevi of left upper limb, including shoulder: Secondary | ICD-10-CM | POA: Diagnosis not present

## 2020-03-15 DIAGNOSIS — X32XXXA Exposure to sunlight, initial encounter: Secondary | ICD-10-CM | POA: Diagnosis not present

## 2020-03-15 DIAGNOSIS — L821 Other seborrheic keratosis: Secondary | ICD-10-CM | POA: Diagnosis not present

## 2020-03-19 ENCOUNTER — Ambulatory Visit: Payer: PPO | Admitting: Podiatry

## 2020-03-19 ENCOUNTER — Other Ambulatory Visit: Payer: Self-pay

## 2020-03-19 ENCOUNTER — Encounter: Payer: Self-pay | Admitting: Podiatry

## 2020-03-19 DIAGNOSIS — L600 Ingrowing nail: Secondary | ICD-10-CM | POA: Diagnosis not present

## 2020-03-19 DIAGNOSIS — E7800 Pure hypercholesterolemia, unspecified: Secondary | ICD-10-CM | POA: Insufficient documentation

## 2020-03-19 DIAGNOSIS — H9190 Unspecified hearing loss, unspecified ear: Secondary | ICD-10-CM | POA: Insufficient documentation

## 2020-03-19 DIAGNOSIS — R42 Dizziness and giddiness: Secondary | ICD-10-CM | POA: Insufficient documentation

## 2020-03-19 NOTE — Progress Notes (Signed)
Subjective:  Patient ID: Adrian Reyes, male    DOB: 1941-03-22,  MRN: [id number] HPI Chief Complaint  Patient presents with  . Nail Problem    Patient presents today for nail fungus right great toenail and bilat 5th toenails x years.  He also c/o ingrown toenails bilat hallux medial border x 1 month.  He says left is worse than right and its tender to touch    79 y.o. male presents with the above complaint.   ROS: Denies fever chills nausea vomiting muscle aches pains calf pain back pain chest pain shortness of breath.  Past Medical History:  Diagnosis Date  . Arthritis   . Renal disorder    Past Surgical History:  Procedure Laterality Date  . JOINT REPLACEMENT      Current Outpatient Medications:  .  atorvastatin (LIPITOR) 40 MG tablet, Take 1 tablet by mouth daily., Disp: , Rfl:  .  chlorthalidone (HYGROTON) 25 MG tablet, Take 0.5 tablets by mouth daily., Disp: , Rfl:  .  diphenoxylate-atropine (LOMOTIL) 2.5-0.025 MG tablet, 4 (four) times daily as needed for Diarrhea, Disp: , Rfl:  .  EPINEPHrine 0.3 mg/0.3 mL IJ SOAJ injection, INJECT 0.3 MLS (0.3 MG TOTAL) INTO THE MUSCLE ONCE AS NEEDED FOR ANAPHYLAXIS FOR UP TO 1 DOSE, Disp: , Rfl:  .  ezetimibe (ZETIA) 10 MG tablet, Take by mouth., Disp: , Rfl:  .  fluticasone (FLONASE) 50 MCG/ACT nasal spray, Place into the nose., Disp: , Rfl:  .  losartan (COZAAR) 100 MG tablet, Take by mouth., Disp: , Rfl:  .  Multiple Vitamins-Minerals (DAILY MULTIVITAMIN) CAPS, Take by mouth., Disp: , Rfl:  .  acetaminophen (TYLENOL) 650 MG CR tablet, Take by mouth., Disp: , Rfl:  .  aspirin 81 MG EC tablet, Take by mouth., Disp: , Rfl:  .  hydrocortisone valerate cream (WESTCORT) 0.2 %, , Disp: , Rfl:  .  loratadine-pseudoephedrine (CLARITIN-D 12-HOUR) 5-120 MG tablet, Take by mouth., Disp: , Rfl:   Allergies  Allergen Reactions  . Shellfish Allergy Anaphylaxis    Severe swelling   Review of Systems Objective:  There were no vitals  filed for this visit.  General: Well developed, nourished, in no acute distress, alert and oriented x3   Dermatological: Skin is warm, dry and supple bilateral. Nails x 10 are well maintained; remaining integument appears unremarkable at this time. There are no open sores, no preulcerative lesions, no rash or signs of infection present.  Sharp incurvated nail margins along the tibial border of the hallux bilaterally and fifth digit left foot lateral border.  No erythema cellulitis drainage or odor hallux nail plate right is more dystrophic than the rest.  There is no signs of tinea pedis.  Cannot rule out onychomycosis.  Vascular: Dorsalis Pedis artery and Posterior Tibial artery pedal pulses are 2/4 bilateral with immedate capillary fill time. Pedal hair growth present. No varicosities and no lower extremity edema present bilateral.   Neruologic: Grossly intact via light touch bilateral. Vibratory intact via tuning fork bilateral. Protective threshold with Semmes Wienstein monofilament intact to all pedal sites bilateral. Patellar and Achilles deep tendon reflexes 2+ bilateral. No Babinski or clonus noted bilateral.   Musculoskeletal: No gross boney pedal deformities bilateral. No pain, crepitus, or limitation noted with foot and ankle range of motion bilateral. Muscular strength 5/5 in all groups tested bilateral.  Gait: Unassisted, Nonantalgic.    Radiographs:  None taken  Assessment & Plan:   Assessment: Painful ingrown toenail borders tibial borders  hallux bilateral lateral border fifth left.  Rule out onychomycosis hallux right.  Plan: Discussed procedures today he would like to hold off until after Thanksgiving and will follow up with me in about 1 month from now.     Jaki Steptoe T. [place], [place]

## 2020-03-27 DIAGNOSIS — C4442 Squamous cell carcinoma of skin of scalp and neck: Secondary | ICD-10-CM | POA: Diagnosis not present

## 2020-03-28 ENCOUNTER — Other Ambulatory Visit: Payer: Self-pay

## 2020-03-28 ENCOUNTER — Ambulatory Visit
Admission: RE | Admit: 2020-03-28 | Discharge: 2020-03-28 | Disposition: A | Discharge status: Home / Self Care | Payer: PPO | Source: Ambulatory Visit | Attending: Sports Medicine | Admitting: Sports Medicine

## 2020-03-28 DIAGNOSIS — M76891 Other specified enthesopathies of right lower limb, excluding foot: Secondary | ICD-10-CM | POA: Diagnosis not present

## 2020-03-28 DIAGNOSIS — M6258 Muscle wasting and atrophy, not elsewhere classified, other site: Secondary | ICD-10-CM | POA: Diagnosis not present

## 2020-03-28 DIAGNOSIS — S76019S Strain of muscle, fascia and tendon of unspecified hip, sequela: Secondary | ICD-10-CM

## 2020-03-28 DIAGNOSIS — S76311A Strain of muscle, fascia and tendon of the posterior muscle group at thigh level, right thigh, initial encounter: Secondary | ICD-10-CM | POA: Diagnosis not present

## 2020-03-28 DIAGNOSIS — M7061 Trochanteric bursitis, right hip: Secondary | ICD-10-CM | POA: Diagnosis not present

## 2020-03-28 DIAGNOSIS — M25551 Pain in right hip: Secondary | ICD-10-CM | POA: Diagnosis not present

## 2020-03-28 DIAGNOSIS — G8929 Other chronic pain: Secondary | ICD-10-CM | POA: Diagnosis not present

## 2020-03-29 DIAGNOSIS — M25551 Pain in right hip: Secondary | ICD-10-CM | POA: Diagnosis not present

## 2020-03-29 DIAGNOSIS — G8929 Other chronic pain: Secondary | ICD-10-CM | POA: Diagnosis not present

## 2020-04-04 DIAGNOSIS — G8929 Other chronic pain: Secondary | ICD-10-CM | POA: Diagnosis not present

## 2020-04-04 DIAGNOSIS — M25551 Pain in right hip: Secondary | ICD-10-CM | POA: Diagnosis not present

## 2020-04-11 ENCOUNTER — Ambulatory Visit: Payer: PPO | Admitting: Podiatry

## 2020-04-16 DIAGNOSIS — G8929 Other chronic pain: Secondary | ICD-10-CM | POA: Diagnosis not present

## 2020-04-16 DIAGNOSIS — M25551 Pain in right hip: Secondary | ICD-10-CM | POA: Diagnosis not present

## 2020-04-18 DIAGNOSIS — M25551 Pain in right hip: Secondary | ICD-10-CM | POA: Diagnosis not present

## 2020-04-18 DIAGNOSIS — G8929 Other chronic pain: Secondary | ICD-10-CM | POA: Diagnosis not present

## 2020-04-24 DIAGNOSIS — G8929 Other chronic pain: Secondary | ICD-10-CM | POA: Diagnosis not present

## 2020-04-24 DIAGNOSIS — M25551 Pain in right hip: Secondary | ICD-10-CM | POA: Diagnosis not present

## 2020-04-26 DIAGNOSIS — G8929 Other chronic pain: Secondary | ICD-10-CM | POA: Diagnosis not present

## 2020-04-26 DIAGNOSIS — M25551 Pain in right hip: Secondary | ICD-10-CM | POA: Diagnosis not present

## 2020-05-01 DIAGNOSIS — M76891 Other specified enthesopathies of right lower limb, excluding foot: Secondary | ICD-10-CM | POA: Diagnosis not present

## 2020-05-01 DIAGNOSIS — M25551 Pain in right hip: Secondary | ICD-10-CM | POA: Diagnosis not present

## 2020-05-01 DIAGNOSIS — M7061 Trochanteric bursitis, right hip: Secondary | ICD-10-CM | POA: Diagnosis not present

## 2020-05-01 DIAGNOSIS — G8929 Other chronic pain: Secondary | ICD-10-CM | POA: Diagnosis not present

## 2020-05-01 DIAGNOSIS — S76019S Strain of muscle, fascia and tendon of unspecified hip, sequela: Secondary | ICD-10-CM | POA: Diagnosis not present

## 2020-05-10 DIAGNOSIS — G8929 Other chronic pain: Secondary | ICD-10-CM | POA: Diagnosis not present

## 2020-05-10 DIAGNOSIS — M25551 Pain in right hip: Secondary | ICD-10-CM | POA: Diagnosis not present

## 2020-05-17 DIAGNOSIS — M25551 Pain in right hip: Secondary | ICD-10-CM | POA: Diagnosis not present

## 2020-05-17 DIAGNOSIS — G8929 Other chronic pain: Secondary | ICD-10-CM | POA: Diagnosis not present

## 2020-05-22 DIAGNOSIS — M25551 Pain in right hip: Secondary | ICD-10-CM | POA: Diagnosis not present

## 2020-05-22 DIAGNOSIS — G8929 Other chronic pain: Secondary | ICD-10-CM | POA: Diagnosis not present

## 2020-05-24 DIAGNOSIS — G8929 Other chronic pain: Secondary | ICD-10-CM | POA: Diagnosis not present

## 2020-05-24 DIAGNOSIS — M25551 Pain in right hip: Secondary | ICD-10-CM | POA: Diagnosis not present

## 2020-05-29 DIAGNOSIS — G8929 Other chronic pain: Secondary | ICD-10-CM | POA: Diagnosis not present

## 2020-05-29 DIAGNOSIS — M25551 Pain in right hip: Secondary | ICD-10-CM | POA: Diagnosis not present

## 2020-05-31 DIAGNOSIS — G8929 Other chronic pain: Secondary | ICD-10-CM | POA: Diagnosis not present

## 2020-05-31 DIAGNOSIS — M25551 Pain in right hip: Secondary | ICD-10-CM | POA: Diagnosis not present

## 2020-06-07 DIAGNOSIS — G8929 Other chronic pain: Secondary | ICD-10-CM | POA: Diagnosis not present

## 2020-06-07 DIAGNOSIS — M25551 Pain in right hip: Secondary | ICD-10-CM | POA: Diagnosis not present

## 2020-06-12 DIAGNOSIS — M25551 Pain in right hip: Secondary | ICD-10-CM | POA: Diagnosis not present

## 2020-06-12 DIAGNOSIS — G8929 Other chronic pain: Secondary | ICD-10-CM | POA: Diagnosis not present

## 2020-06-14 DIAGNOSIS — G8929 Other chronic pain: Secondary | ICD-10-CM | POA: Diagnosis not present

## 2020-06-14 DIAGNOSIS — M25551 Pain in right hip: Secondary | ICD-10-CM | POA: Diagnosis not present

## 2020-06-19 DIAGNOSIS — M25551 Pain in right hip: Secondary | ICD-10-CM | POA: Diagnosis not present

## 2020-06-19 DIAGNOSIS — H35372 Puckering of macula, left eye: Secondary | ICD-10-CM | POA: Diagnosis not present

## 2020-06-19 DIAGNOSIS — H26492 Other secondary cataract, left eye: Secondary | ICD-10-CM | POA: Diagnosis not present

## 2020-06-19 DIAGNOSIS — H11153 Pinguecula, bilateral: Secondary | ICD-10-CM | POA: Diagnosis not present

## 2020-06-19 DIAGNOSIS — G8929 Other chronic pain: Secondary | ICD-10-CM | POA: Diagnosis not present

## 2020-06-21 DIAGNOSIS — G8929 Other chronic pain: Secondary | ICD-10-CM | POA: Diagnosis not present

## 2020-06-21 DIAGNOSIS — M25551 Pain in right hip: Secondary | ICD-10-CM | POA: Diagnosis not present

## 2020-06-28 DIAGNOSIS — M25551 Pain in right hip: Secondary | ICD-10-CM | POA: Diagnosis not present

## 2020-06-28 DIAGNOSIS — G8929 Other chronic pain: Secondary | ICD-10-CM | POA: Diagnosis not present

## 2020-07-03 DIAGNOSIS — G8929 Other chronic pain: Secondary | ICD-10-CM | POA: Diagnosis not present

## 2020-07-03 DIAGNOSIS — M25551 Pain in right hip: Secondary | ICD-10-CM | POA: Diagnosis not present

## 2020-07-05 DIAGNOSIS — G8929 Other chronic pain: Secondary | ICD-10-CM | POA: Diagnosis not present

## 2020-07-05 DIAGNOSIS — M25551 Pain in right hip: Secondary | ICD-10-CM | POA: Diagnosis not present

## 2020-07-10 DIAGNOSIS — M25551 Pain in right hip: Secondary | ICD-10-CM | POA: Diagnosis not present

## 2020-07-10 DIAGNOSIS — G8929 Other chronic pain: Secondary | ICD-10-CM | POA: Diagnosis not present

## 2020-07-12 DIAGNOSIS — M25551 Pain in right hip: Secondary | ICD-10-CM | POA: Diagnosis not present

## 2020-07-12 DIAGNOSIS — G8929 Other chronic pain: Secondary | ICD-10-CM | POA: Diagnosis not present

## 2020-07-16 DIAGNOSIS — Z20822 Contact with and (suspected) exposure to covid-19: Secondary | ICD-10-CM | POA: Diagnosis not present

## 2020-07-18 DIAGNOSIS — Z01812 Encounter for preprocedural laboratory examination: Secondary | ICD-10-CM | POA: Diagnosis not present

## 2020-07-18 DIAGNOSIS — I10 Essential (primary) hypertension: Secondary | ICD-10-CM | POA: Diagnosis not present

## 2020-07-18 DIAGNOSIS — Z20822 Contact with and (suspected) exposure to covid-19: Secondary | ICD-10-CM | POA: Diagnosis not present

## 2020-07-18 DIAGNOSIS — Z6832 Body mass index (BMI) 32.0-32.9, adult: Secondary | ICD-10-CM | POA: Diagnosis not present

## 2020-07-18 DIAGNOSIS — E785 Hyperlipidemia, unspecified: Secondary | ICD-10-CM | POA: Diagnosis not present

## 2020-07-18 DIAGNOSIS — D447 Neoplasm of uncertain behavior of aortic body and other paraganglia: Secondary | ICD-10-CM | POA: Diagnosis not present

## 2020-07-18 DIAGNOSIS — K5900 Constipation, unspecified: Secondary | ICD-10-CM | POA: Diagnosis not present

## 2020-07-18 DIAGNOSIS — G4733 Obstructive sleep apnea (adult) (pediatric): Secondary | ICD-10-CM | POA: Diagnosis not present

## 2020-07-18 DIAGNOSIS — E669 Obesity, unspecified: Secondary | ICD-10-CM | POA: Diagnosis not present

## 2020-07-26 DIAGNOSIS — S4992XA Unspecified injury of left shoulder and upper arm, initial encounter: Secondary | ICD-10-CM | POA: Diagnosis not present

## 2020-07-26 DIAGNOSIS — M25512 Pain in left shoulder: Secondary | ICD-10-CM | POA: Diagnosis not present

## 2020-07-26 DIAGNOSIS — W010XXA Fall on same level from slipping, tripping and stumbling without subsequent striking against object, initial encounter: Secondary | ICD-10-CM | POA: Diagnosis not present

## 2020-08-14 DIAGNOSIS — D225 Melanocytic nevi of trunk: Secondary | ICD-10-CM | POA: Diagnosis not present

## 2020-08-14 DIAGNOSIS — D2271 Melanocytic nevi of right lower limb, including hip: Secondary | ICD-10-CM | POA: Diagnosis not present

## 2020-08-14 DIAGNOSIS — S4992XD Unspecified injury of left shoulder and upper arm, subsequent encounter: Secondary | ICD-10-CM | POA: Diagnosis not present

## 2020-08-14 DIAGNOSIS — S43102D Unspecified dislocation of left acromioclavicular joint, subsequent encounter: Secondary | ICD-10-CM | POA: Diagnosis not present

## 2020-08-14 DIAGNOSIS — Z85820 Personal history of malignant melanoma of skin: Secondary | ICD-10-CM | POA: Diagnosis not present

## 2020-08-14 DIAGNOSIS — Z85828 Personal history of other malignant neoplasm of skin: Secondary | ICD-10-CM | POA: Diagnosis not present

## 2020-08-14 DIAGNOSIS — W010XXD Fall on same level from slipping, tripping and stumbling without subsequent striking against object, subsequent encounter: Secondary | ICD-10-CM | POA: Diagnosis not present

## 2020-08-14 DIAGNOSIS — M25512 Pain in left shoulder: Secondary | ICD-10-CM | POA: Diagnosis not present

## 2020-08-14 DIAGNOSIS — X32XXXA Exposure to sunlight, initial encounter: Secondary | ICD-10-CM | POA: Diagnosis not present

## 2020-08-14 DIAGNOSIS — L570 Actinic keratosis: Secondary | ICD-10-CM | POA: Diagnosis not present

## 2020-08-14 DIAGNOSIS — D2261 Melanocytic nevi of right upper limb, including shoulder: Secondary | ICD-10-CM | POA: Diagnosis not present

## 2020-08-16 DIAGNOSIS — I10 Essential (primary) hypertension: Secondary | ICD-10-CM | POA: Diagnosis not present

## 2020-08-16 DIAGNOSIS — N1832 Chronic kidney disease, stage 3b: Secondary | ICD-10-CM | POA: Diagnosis not present

## 2020-08-20 DIAGNOSIS — H90A32 Mixed conductive and sensorineural hearing loss, unilateral, left ear with restricted hearing on the contralateral side: Secondary | ICD-10-CM | POA: Diagnosis not present

## 2020-08-29 DIAGNOSIS — N1832 Chronic kidney disease, stage 3b: Secondary | ICD-10-CM | POA: Diagnosis not present

## 2020-08-29 DIAGNOSIS — D631 Anemia in chronic kidney disease: Secondary | ICD-10-CM | POA: Diagnosis not present

## 2020-09-03 DIAGNOSIS — M25612 Stiffness of left shoulder, not elsewhere classified: Secondary | ICD-10-CM | POA: Diagnosis not present

## 2020-09-03 DIAGNOSIS — M25512 Pain in left shoulder: Secondary | ICD-10-CM | POA: Diagnosis not present

## 2020-09-03 DIAGNOSIS — G8929 Other chronic pain: Secondary | ICD-10-CM | POA: Diagnosis not present

## 2020-09-03 DIAGNOSIS — M6281 Muscle weakness (generalized): Secondary | ICD-10-CM | POA: Diagnosis not present

## 2020-09-10 DIAGNOSIS — S43102D Unspecified dislocation of left acromioclavicular joint, subsequent encounter: Secondary | ICD-10-CM | POA: Diagnosis not present

## 2020-09-10 DIAGNOSIS — W010XXD Fall on same level from slipping, tripping and stumbling without subsequent striking against object, subsequent encounter: Secondary | ICD-10-CM | POA: Diagnosis not present

## 2020-09-10 DIAGNOSIS — M25512 Pain in left shoulder: Secondary | ICD-10-CM | POA: Diagnosis not present

## 2020-09-11 DIAGNOSIS — M25512 Pain in left shoulder: Secondary | ICD-10-CM | POA: Diagnosis not present

## 2020-09-11 DIAGNOSIS — G8929 Other chronic pain: Secondary | ICD-10-CM | POA: Diagnosis not present

## 2020-09-13 DIAGNOSIS — G8929 Other chronic pain: Secondary | ICD-10-CM | POA: Diagnosis not present

## 2020-09-13 DIAGNOSIS — M25512 Pain in left shoulder: Secondary | ICD-10-CM | POA: Diagnosis not present

## 2020-09-17 DIAGNOSIS — M1712 Unilateral primary osteoarthritis, left knee: Secondary | ICD-10-CM | POA: Diagnosis not present

## 2020-09-17 DIAGNOSIS — Z96652 Presence of left artificial knee joint: Secondary | ICD-10-CM | POA: Diagnosis not present

## 2020-09-17 DIAGNOSIS — M25562 Pain in left knee: Secondary | ICD-10-CM | POA: Diagnosis not present

## 2020-09-18 DIAGNOSIS — M25512 Pain in left shoulder: Secondary | ICD-10-CM | POA: Diagnosis not present

## 2020-09-18 DIAGNOSIS — G8929 Other chronic pain: Secondary | ICD-10-CM | POA: Diagnosis not present

## 2020-09-20 DIAGNOSIS — G8929 Other chronic pain: Secondary | ICD-10-CM | POA: Diagnosis not present

## 2020-09-20 DIAGNOSIS — M25512 Pain in left shoulder: Secondary | ICD-10-CM | POA: Diagnosis not present

## 2020-09-25 DIAGNOSIS — M25512 Pain in left shoulder: Secondary | ICD-10-CM | POA: Diagnosis not present

## 2020-09-25 DIAGNOSIS — G8929 Other chronic pain: Secondary | ICD-10-CM | POA: Diagnosis not present

## 2020-09-27 DIAGNOSIS — G8929 Other chronic pain: Secondary | ICD-10-CM | POA: Diagnosis not present

## 2020-09-27 DIAGNOSIS — M25512 Pain in left shoulder: Secondary | ICD-10-CM | POA: Diagnosis not present

## 2020-10-05 DIAGNOSIS — M25411 Effusion, right shoulder: Secondary | ICD-10-CM | POA: Diagnosis not present

## 2020-10-05 DIAGNOSIS — S46211A Strain of muscle, fascia and tendon of other parts of biceps, right arm, initial encounter: Secondary | ICD-10-CM | POA: Diagnosis not present

## 2020-10-05 DIAGNOSIS — M25511 Pain in right shoulder: Secondary | ICD-10-CM | POA: Diagnosis not present

## 2020-10-23 DIAGNOSIS — M25512 Pain in left shoulder: Secondary | ICD-10-CM | POA: Diagnosis not present

## 2020-10-23 DIAGNOSIS — G8929 Other chronic pain: Secondary | ICD-10-CM | POA: Diagnosis not present

## 2020-11-01 DIAGNOSIS — G8929 Other chronic pain: Secondary | ICD-10-CM | POA: Diagnosis not present

## 2020-11-01 DIAGNOSIS — M25512 Pain in left shoulder: Secondary | ICD-10-CM | POA: Diagnosis not present

## 2020-11-05 DIAGNOSIS — M1712 Unilateral primary osteoarthritis, left knee: Secondary | ICD-10-CM | POA: Diagnosis not present

## 2020-11-05 DIAGNOSIS — Z96652 Presence of left artificial knee joint: Secondary | ICD-10-CM | POA: Diagnosis not present

## 2020-11-07 DIAGNOSIS — Z125 Encounter for screening for malignant neoplasm of prostate: Secondary | ICD-10-CM | POA: Diagnosis not present

## 2020-11-07 DIAGNOSIS — Z87448 Personal history of other diseases of urinary system: Secondary | ICD-10-CM | POA: Diagnosis not present

## 2020-11-07 DIAGNOSIS — Z0000 Encounter for general adult medical examination without abnormal findings: Secondary | ICD-10-CM | POA: Diagnosis not present

## 2020-11-07 DIAGNOSIS — R7989 Other specified abnormal findings of blood chemistry: Secondary | ICD-10-CM | POA: Diagnosis not present

## 2020-11-07 DIAGNOSIS — E782 Mixed hyperlipidemia: Secondary | ICD-10-CM | POA: Diagnosis not present

## 2020-11-07 DIAGNOSIS — I10 Essential (primary) hypertension: Secondary | ICD-10-CM | POA: Diagnosis not present

## 2020-11-07 DIAGNOSIS — E663 Overweight: Secondary | ICD-10-CM | POA: Diagnosis not present

## 2020-11-08 DIAGNOSIS — M25512 Pain in left shoulder: Secondary | ICD-10-CM | POA: Diagnosis not present

## 2020-11-08 DIAGNOSIS — G8929 Other chronic pain: Secondary | ICD-10-CM | POA: Diagnosis not present

## 2020-11-13 DIAGNOSIS — G8929 Other chronic pain: Secondary | ICD-10-CM | POA: Diagnosis not present

## 2020-11-13 DIAGNOSIS — M25512 Pain in left shoulder: Secondary | ICD-10-CM | POA: Diagnosis not present

## 2020-11-27 DIAGNOSIS — G8929 Other chronic pain: Secondary | ICD-10-CM | POA: Diagnosis not present

## 2020-11-27 DIAGNOSIS — M25512 Pain in left shoulder: Secondary | ICD-10-CM | POA: Diagnosis not present

## 2020-11-28 DIAGNOSIS — H90A32 Mixed conductive and sensorineural hearing loss, unilateral, left ear with restricted hearing on the contralateral side: Secondary | ICD-10-CM | POA: Diagnosis not present

## 2020-11-29 DIAGNOSIS — M25512 Pain in left shoulder: Secondary | ICD-10-CM | POA: Diagnosis not present

## 2020-11-29 DIAGNOSIS — G8929 Other chronic pain: Secondary | ICD-10-CM | POA: Diagnosis not present

## 2020-12-04 DIAGNOSIS — M25512 Pain in left shoulder: Secondary | ICD-10-CM | POA: Diagnosis not present

## 2020-12-04 DIAGNOSIS — G8929 Other chronic pain: Secondary | ICD-10-CM | POA: Diagnosis not present

## 2020-12-06 DIAGNOSIS — G8929 Other chronic pain: Secondary | ICD-10-CM | POA: Diagnosis not present

## 2020-12-06 DIAGNOSIS — M25512 Pain in left shoulder: Secondary | ICD-10-CM | POA: Diagnosis not present

## 2020-12-13 DIAGNOSIS — I10 Essential (primary) hypertension: Secondary | ICD-10-CM | POA: Diagnosis not present

## 2020-12-13 DIAGNOSIS — N1832 Chronic kidney disease, stage 3b: Secondary | ICD-10-CM | POA: Diagnosis not present

## 2020-12-14 DIAGNOSIS — M25512 Pain in left shoulder: Secondary | ICD-10-CM | POA: Diagnosis not present

## 2020-12-14 DIAGNOSIS — G8929 Other chronic pain: Secondary | ICD-10-CM | POA: Diagnosis not present

## 2020-12-18 DIAGNOSIS — G8929 Other chronic pain: Secondary | ICD-10-CM | POA: Diagnosis not present

## 2020-12-18 DIAGNOSIS — M25512 Pain in left shoulder: Secondary | ICD-10-CM | POA: Diagnosis not present

## 2020-12-27 DIAGNOSIS — M25512 Pain in left shoulder: Secondary | ICD-10-CM | POA: Diagnosis not present

## 2020-12-27 DIAGNOSIS — G8929 Other chronic pain: Secondary | ICD-10-CM | POA: Diagnosis not present

## 2021-01-02 DIAGNOSIS — Z96652 Presence of left artificial knee joint: Secondary | ICD-10-CM | POA: Diagnosis not present

## 2021-01-02 DIAGNOSIS — M1712 Unilateral primary osteoarthritis, left knee: Secondary | ICD-10-CM | POA: Diagnosis not present

## 2021-01-03 DIAGNOSIS — G8929 Other chronic pain: Secondary | ICD-10-CM | POA: Diagnosis not present

## 2021-01-03 DIAGNOSIS — M25512 Pain in left shoulder: Secondary | ICD-10-CM | POA: Diagnosis not present

## 2021-01-07 DIAGNOSIS — G8929 Other chronic pain: Secondary | ICD-10-CM | POA: Diagnosis not present

## 2021-01-07 DIAGNOSIS — M25512 Pain in left shoulder: Secondary | ICD-10-CM | POA: Diagnosis not present

## 2021-01-09 DIAGNOSIS — G8929 Other chronic pain: Secondary | ICD-10-CM | POA: Diagnosis not present

## 2021-01-09 DIAGNOSIS — M25512 Pain in left shoulder: Secondary | ICD-10-CM | POA: Diagnosis not present

## 2021-02-01 DIAGNOSIS — M25512 Pain in left shoulder: Secondary | ICD-10-CM | POA: Diagnosis not present

## 2021-02-01 DIAGNOSIS — G8929 Other chronic pain: Secondary | ICD-10-CM | POA: Diagnosis not present

## 2021-02-05 DIAGNOSIS — G8929 Other chronic pain: Secondary | ICD-10-CM | POA: Diagnosis not present

## 2021-02-05 DIAGNOSIS — M25512 Pain in left shoulder: Secondary | ICD-10-CM | POA: Diagnosis not present

## 2021-02-07 DIAGNOSIS — D2272 Melanocytic nevi of left lower limb, including hip: Secondary | ICD-10-CM | POA: Diagnosis not present

## 2021-02-07 DIAGNOSIS — C44329 Squamous cell carcinoma of skin of other parts of face: Secondary | ICD-10-CM | POA: Diagnosis not present

## 2021-02-07 DIAGNOSIS — D485 Neoplasm of uncertain behavior of skin: Secondary | ICD-10-CM | POA: Diagnosis not present

## 2021-02-07 DIAGNOSIS — Z85828 Personal history of other malignant neoplasm of skin: Secondary | ICD-10-CM | POA: Diagnosis not present

## 2021-02-07 DIAGNOSIS — X32XXXA Exposure to sunlight, initial encounter: Secondary | ICD-10-CM | POA: Diagnosis not present

## 2021-02-07 DIAGNOSIS — Z85820 Personal history of malignant melanoma of skin: Secondary | ICD-10-CM | POA: Diagnosis not present

## 2021-02-07 DIAGNOSIS — L570 Actinic keratosis: Secondary | ICD-10-CM | POA: Diagnosis not present

## 2021-02-07 DIAGNOSIS — D2261 Melanocytic nevi of right upper limb, including shoulder: Secondary | ICD-10-CM | POA: Diagnosis not present

## 2021-02-07 DIAGNOSIS — D225 Melanocytic nevi of trunk: Secondary | ICD-10-CM | POA: Diagnosis not present

## 2021-02-11 DIAGNOSIS — M25512 Pain in left shoulder: Secondary | ICD-10-CM | POA: Diagnosis not present

## 2021-02-11 DIAGNOSIS — G8929 Other chronic pain: Secondary | ICD-10-CM | POA: Diagnosis not present

## 2021-02-14 DIAGNOSIS — M25512 Pain in left shoulder: Secondary | ICD-10-CM | POA: Diagnosis not present

## 2021-02-14 DIAGNOSIS — G8929 Other chronic pain: Secondary | ICD-10-CM | POA: Diagnosis not present

## 2021-02-20 DIAGNOSIS — M25512 Pain in left shoulder: Secondary | ICD-10-CM | POA: Diagnosis not present

## 2021-02-20 DIAGNOSIS — G8929 Other chronic pain: Secondary | ICD-10-CM | POA: Diagnosis not present

## 2021-02-22 DIAGNOSIS — M25512 Pain in left shoulder: Secondary | ICD-10-CM | POA: Diagnosis not present

## 2021-02-22 DIAGNOSIS — G8929 Other chronic pain: Secondary | ICD-10-CM | POA: Diagnosis not present

## 2021-02-26 DIAGNOSIS — M25512 Pain in left shoulder: Secondary | ICD-10-CM | POA: Diagnosis not present

## 2021-02-26 DIAGNOSIS — M25612 Stiffness of left shoulder, not elsewhere classified: Secondary | ICD-10-CM | POA: Diagnosis not present

## 2021-03-05 DIAGNOSIS — G8929 Other chronic pain: Secondary | ICD-10-CM | POA: Diagnosis not present

## 2021-03-05 DIAGNOSIS — M25512 Pain in left shoulder: Secondary | ICD-10-CM | POA: Diagnosis not present

## 2021-03-07 DIAGNOSIS — M25512 Pain in left shoulder: Secondary | ICD-10-CM | POA: Diagnosis not present

## 2021-03-07 DIAGNOSIS — G8929 Other chronic pain: Secondary | ICD-10-CM | POA: Diagnosis not present

## 2021-03-11 DIAGNOSIS — D447 Neoplasm of uncertain behavior of aortic body and other paraganglia: Secondary | ICD-10-CM | POA: Diagnosis not present

## 2021-03-11 DIAGNOSIS — H90A32 Mixed conductive and sensorineural hearing loss, unilateral, left ear with restricted hearing on the contralateral side: Secondary | ICD-10-CM | POA: Diagnosis not present

## 2021-03-12 ENCOUNTER — Other Ambulatory Visit (HOSPITAL_COMMUNITY): Payer: Self-pay | Admitting: Otolaryngology

## 2021-03-12 ENCOUNTER — Other Ambulatory Visit: Payer: Self-pay | Admitting: Otolaryngology

## 2021-03-12 DIAGNOSIS — H9190 Unspecified hearing loss, unspecified ear: Secondary | ICD-10-CM

## 2021-03-12 DIAGNOSIS — M25512 Pain in left shoulder: Secondary | ICD-10-CM | POA: Diagnosis not present

## 2021-03-12 DIAGNOSIS — G8929 Other chronic pain: Secondary | ICD-10-CM | POA: Diagnosis not present

## 2021-03-14 DIAGNOSIS — L578 Other skin changes due to chronic exposure to nonionizing radiation: Secondary | ICD-10-CM | POA: Diagnosis not present

## 2021-03-14 DIAGNOSIS — D0439 Carcinoma in situ of skin of other parts of face: Secondary | ICD-10-CM | POA: Diagnosis not present

## 2021-03-14 DIAGNOSIS — L814 Other melanin hyperpigmentation: Secondary | ICD-10-CM | POA: Diagnosis not present

## 2021-03-14 DIAGNOSIS — C44329 Squamous cell carcinoma of skin of other parts of face: Secondary | ICD-10-CM | POA: Diagnosis not present

## 2021-03-14 DIAGNOSIS — L988 Other specified disorders of the skin and subcutaneous tissue: Secondary | ICD-10-CM | POA: Diagnosis not present

## 2021-03-28 DIAGNOSIS — M25512 Pain in left shoulder: Secondary | ICD-10-CM | POA: Diagnosis not present

## 2021-03-28 DIAGNOSIS — G8929 Other chronic pain: Secondary | ICD-10-CM | POA: Diagnosis not present

## 2021-04-10 ENCOUNTER — Ambulatory Visit
Admission: RE | Admit: 2021-04-10 | Discharge: 2021-04-10 | Disposition: A | Discharge status: Home / Self Care | Payer: PPO | Source: Ambulatory Visit | Attending: Otolaryngology | Admitting: Otolaryngology

## 2021-04-10 ENCOUNTER — Other Ambulatory Visit: Payer: Self-pay

## 2021-04-10 DIAGNOSIS — Z01818 Encounter for other preprocedural examination: Secondary | ICD-10-CM | POA: Diagnosis not present

## 2021-04-10 DIAGNOSIS — H9192 Unspecified hearing loss, left ear: Secondary | ICD-10-CM | POA: Diagnosis not present

## 2021-04-10 DIAGNOSIS — T8130XA Disruption of wound, unspecified, initial encounter: Secondary | ICD-10-CM | POA: Diagnosis not present

## 2021-04-10 DIAGNOSIS — H9190 Unspecified hearing loss, unspecified ear: Secondary | ICD-10-CM

## 2021-04-10 DIAGNOSIS — H73892 Other specified disorders of tympanic membrane, left ear: Secondary | ICD-10-CM | POA: Diagnosis not present

## 2022-06-07 IMAGING — CT CT TEMPORAL BONES W/O CM
4 of 11 series · 14 of 40 positions shown, 16 images · non-contrast
Comparison: 05/03/2010

CLINICAL DATA: Hearing loss asymmetric to the left.

EXAM:
CT TEMPORAL BONES WITHOUT CONTRAST
TECHNIQUE: Axial and coronal plane CT imaging of the petrous temporal bones was
performed with thin-collimation image reconstruction. No intravenous
contrast was administered. Multiplanar CT image reconstructions were
also generated.

[Series 3: ax soft · axial · 0.32mm/px · z∈[-60,-30]mm · 2 of 45 slices shown]
[im 15/45  brain]
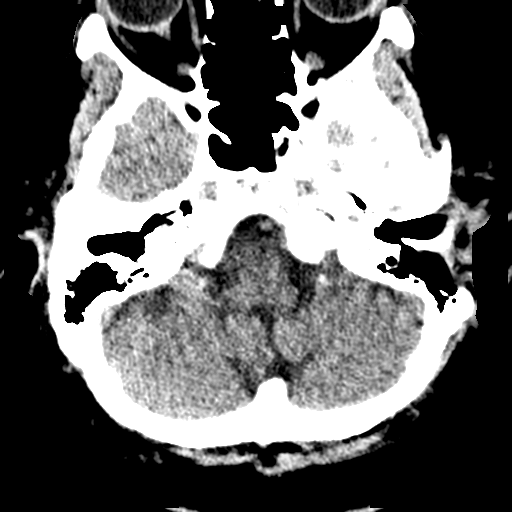
[im 30/45  brain]
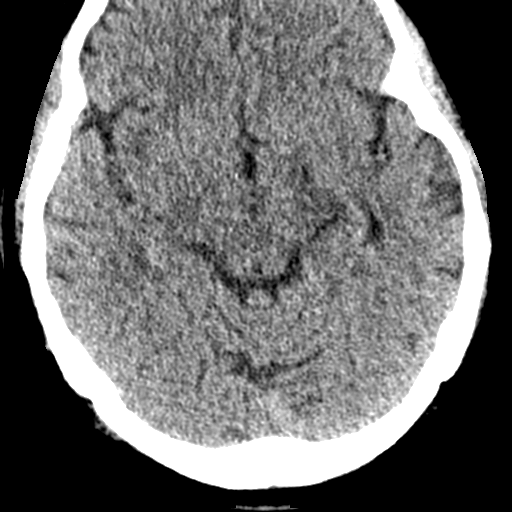

[Series 5: ax mag right · axial · 0.16mm/px · z∈[-90,-36]mm · 8 of 117 slices shown, 10 images (1 of 2)]
[im 13/117  brain]
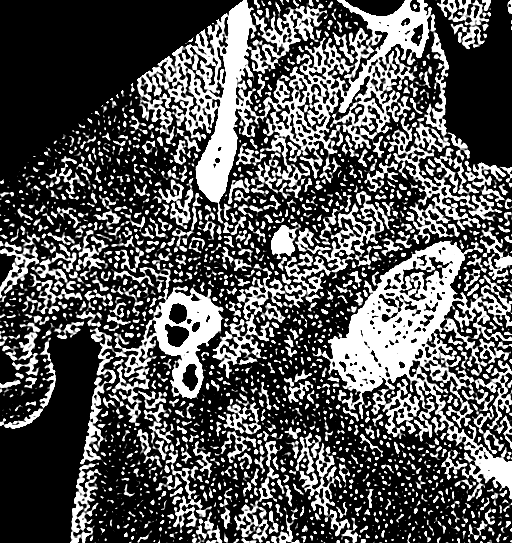
[im 13/117  bone]
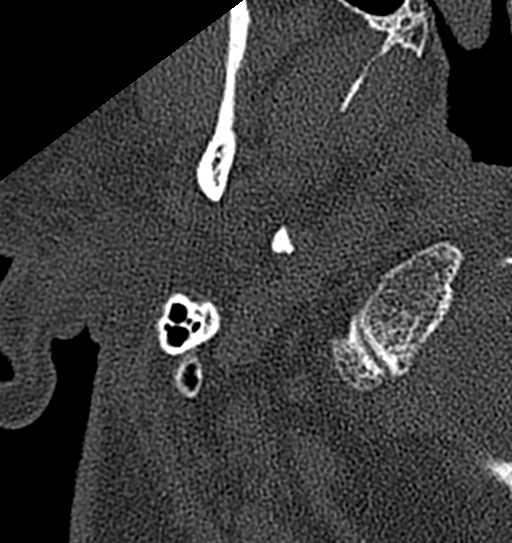
[im 26/117  bone]
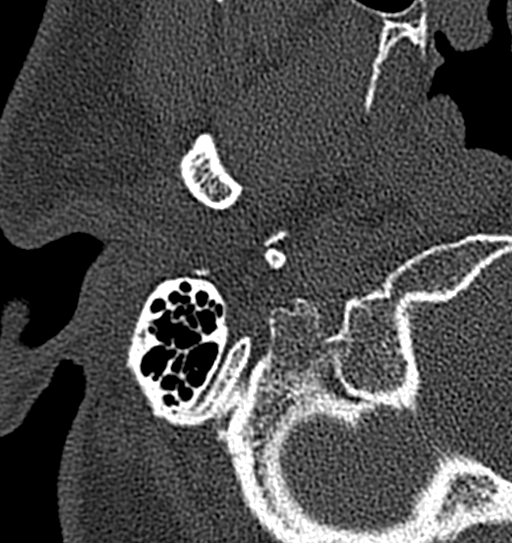
[im 39/117  bone]
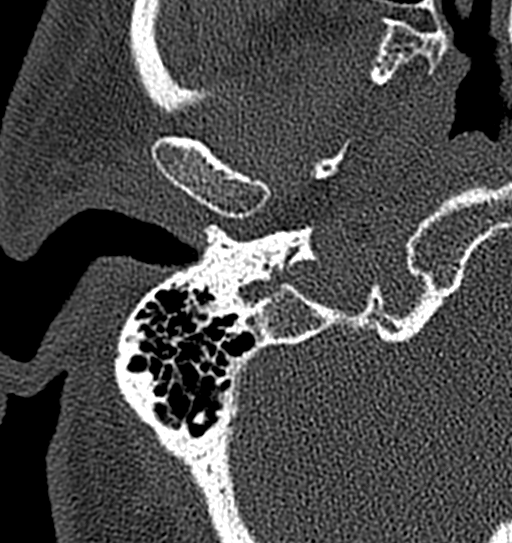
[im 52/117  bone]
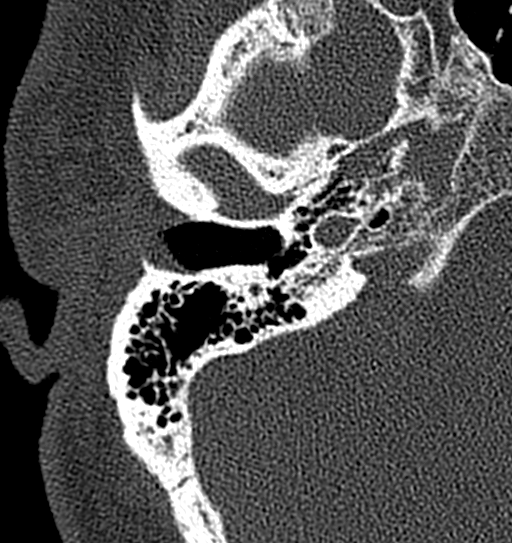
[im 65/117  brain]
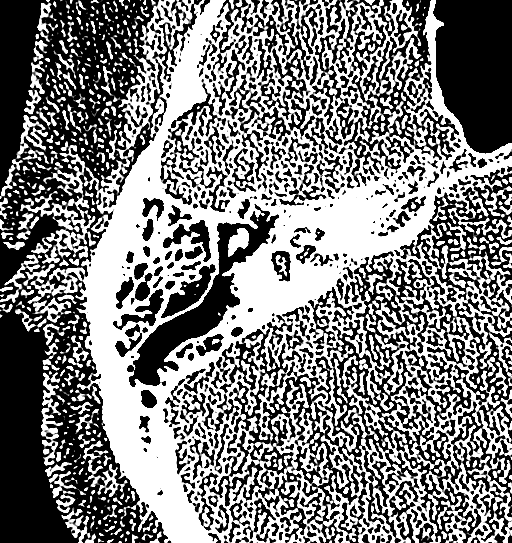
[im 65/117  bone]
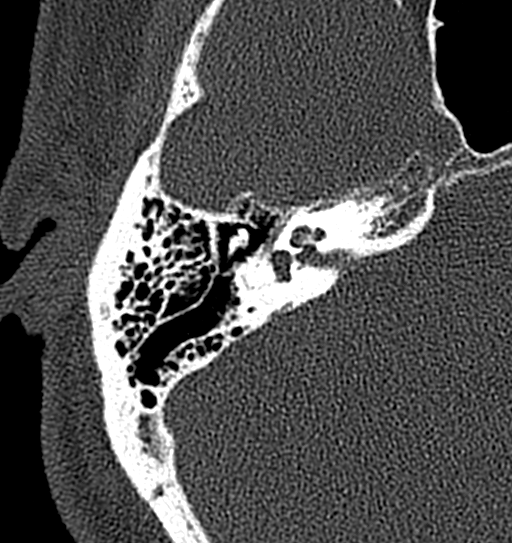
[im 78/117  bone]
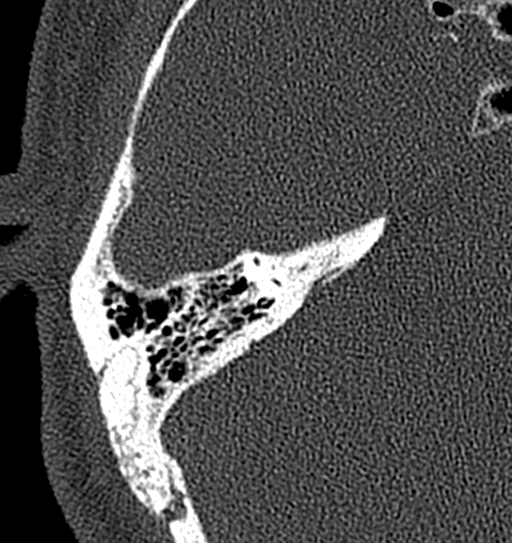
[im 91/117  bone]
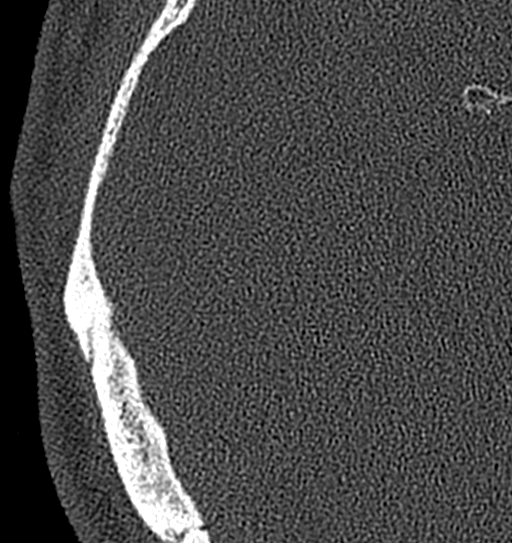
[im 104/117  bone]
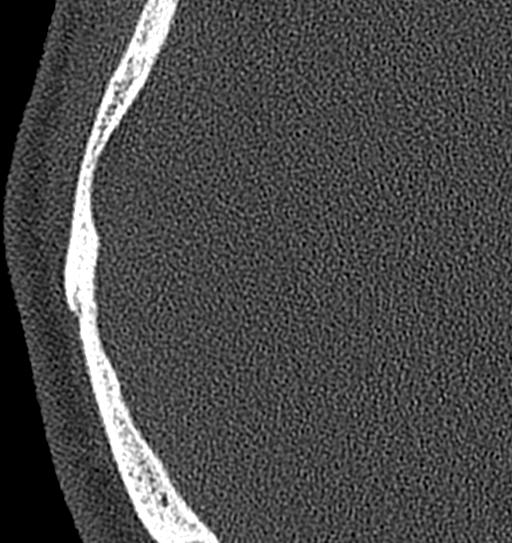

[Series 13: coronal bone. · coronal · 0.12mm/px · 1 of 258 slices shown]
[im 129/258  bone]
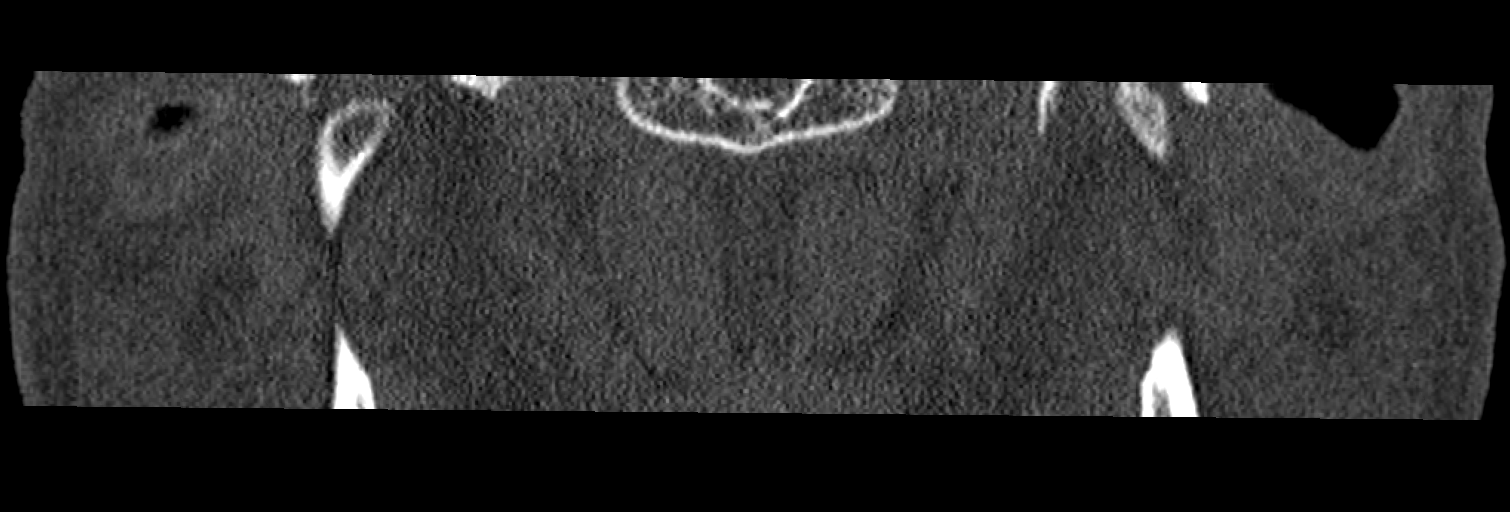

[Series 14: ax mag right · axial · 0.16mm/px · z∈[-96,-77]mm · 3 of 66 slices shown (2 of 2)]
[im 17/66  bone]
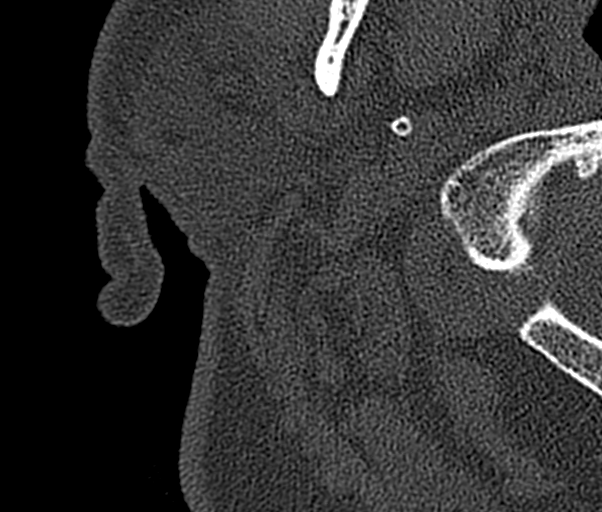
[im 33/66  bone]
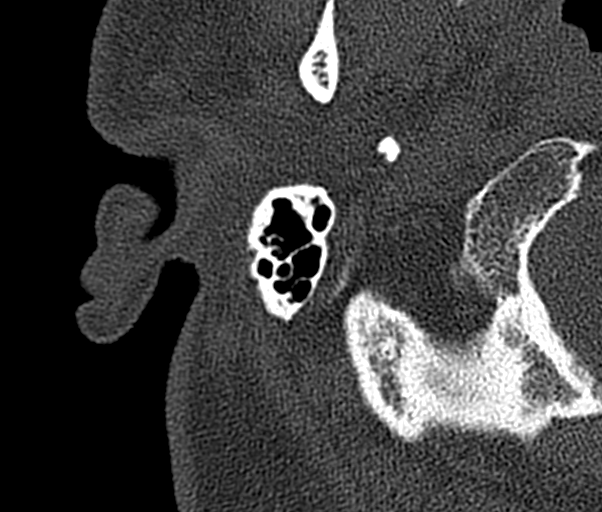
[im 49/66  bone]
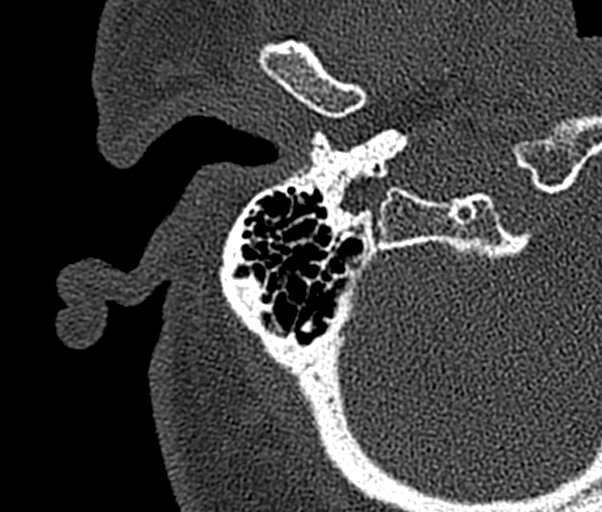

[14 of 40 positions shown; findings below may reference images not displayed]

FINDINGS: RIGHT TEMPORAL BONE

Normal

LEFT TEMPORAL BONE

External auditory canal: Clear

Middle ear cavity: Interval wall up mastoidectomy with resolved
middle ear mass. Thickened tympanic membrane with possible
calcification. No erosive changes. Permeative appearance of the
temporal bone between the facial nerve canal and carotid canal is
nonprogressive.

Inner ear structures: Normal formation and density.

Internal auditory and facial nerve canals: Skeletonization of the
descending facial nerve canal with posterior canal dehiscence that
appears discretely marginated in this likely postoperative.

Mastoid air cells: Wall up mastoidectomy with mild opacification of
the bowl. No erosive changes

Vascular: No progressive soft tissue at the jugular foramen as
permitted by noncontrast technique.

Limited intracranial:  No evidence of intracranial mass.

Visible orbits/paranasal sinuses: Negative
IMPRESSION: Findings of glomus jugulotympanicum with interval surgery. No
progressive bony involvement or recurrent middle ear mass.

Left tympanic membrane thickening since preoperative exam.

## 2024-02-04 ENCOUNTER — Other Ambulatory Visit (INDEPENDENT_AMBULATORY_CARE_PROVIDER_SITE_OTHER): Payer: Self-pay | Admitting: Nurse Practitioner

## 2024-02-04 DIAGNOSIS — I8001 Phlebitis and thrombophlebitis of superficial vessels of right lower extremity: Secondary | ICD-10-CM

## 2024-02-04 DIAGNOSIS — I83891 Varicose veins of right lower extremities with other complications: Secondary | ICD-10-CM

## 2024-02-08 ENCOUNTER — Ambulatory Visit (INDEPENDENT_AMBULATORY_CARE_PROVIDER_SITE_OTHER): Admitting: Vascular Surgery

## 2024-02-08 ENCOUNTER — Encounter (INDEPENDENT_AMBULATORY_CARE_PROVIDER_SITE_OTHER): Payer: Self-pay | Admitting: Vascular Surgery

## 2024-02-08 ENCOUNTER — Ambulatory Visit (INDEPENDENT_AMBULATORY_CARE_PROVIDER_SITE_OTHER)

## 2024-02-08 VITALS — BP 111/65 | HR 63 | Ht 70.0 in | Wt 199.0 lb

## 2024-02-08 DIAGNOSIS — I10 Essential (primary) hypertension: Secondary | ICD-10-CM

## 2024-02-08 DIAGNOSIS — I8001 Phlebitis and thrombophlebitis of superficial vessels of right lower extremity: Secondary | ICD-10-CM

## 2024-02-08 DIAGNOSIS — N183 Chronic kidney disease, stage 3 unspecified: Secondary | ICD-10-CM

## 2024-02-08 DIAGNOSIS — I83891 Varicose veins of right lower extremities with other complications: Secondary | ICD-10-CM

## 2024-02-08 DIAGNOSIS — M1710 Unilateral primary osteoarthritis, unspecified knee: Secondary | ICD-10-CM | POA: Diagnosis not present

## 2024-02-08 DIAGNOSIS — I8310 Varicose veins of unspecified lower extremity with inflammation: Secondary | ICD-10-CM

## 2024-02-16 NOTE — Progress Notes (Unsigned)
 MRN : [id number]  Adrian Reyes is a 83 y.o. (07-05-1940) male who presents with chief complaint of varicose veins hurt.  History of Present Illness: ***  Current Meds  Medication Sig   acetaminophen  (TYLENOL ) 650 MG CR tablet Take by mouth.   aspirin 81 MG EC tablet Take by mouth.   atorvastatin (LIPITOR) 40 MG tablet Take 1 tablet by mouth daily.   chlorthalidone (HYGROTON) 25 MG tablet Take 0.5 tablets by mouth daily. (Patient taking differently: Take 1 tablet by mouth daily.)   diphenoxylate-atropine (LOMOTIL) 2.5-0.025 MG tablet 4 (four) times daily as needed for Diarrhea   EPINEPHrine  0.3 mg/0.3 mL IJ SOAJ injection INJECT 0.3 MLS (0.3 MG TOTAL) INTO THE MUSCLE ONCE AS NEEDED FOR ANAPHYLAXIS FOR UP TO 1 DOSE   ezetimibe (ZETIA) 10 MG tablet Take by mouth.   fluticasone (FLONASE) 50 MCG/ACT nasal spray Place into the nose.   hydrocortisone valerate cream (WESTCORT) 0.2 %    loratadine-pseudoephedrine (CLARITIN-D 12-HOUR) 5-120 MG tablet Take by mouth.   losartan (COZAAR) 100 MG tablet Take by mouth.   Multiple Vitamins-Minerals (DAILY MULTIVITAMIN) CAPS Take by mouth.   sildenafil (VIAGRA) 100 MG tablet Take 100 mg by mouth daily as needed for erectile dysfunction.   testosterone  (ANDROGEL ) 50 MG/5GM (1%) GEL Place 5 g onto the skin daily.    Past Medical History:  Diagnosis Date   Arthritis    Renal disorder     Past Surgical History:  Procedure Laterality Date   JOINT REPLACEMENT      Social History Social History   Tobacco Use   Smoking status: Never   Smokeless tobacco: Never  Substance Use Topics   Alcohol use: Yes    Family History Family History  Problem Relation Age of Onset   Heart disease Mother    Alcohol abuse Father     Allergies  Allergen Reactions   Shellfish Allergy Anaphylaxis    Severe swelling     REVIEW OF SYSTEMS (Negative unless checked)  Constitutional: [] Weight loss  [] Fever  [] Chills Cardiac: [] Chest  pain   [] Chest pressure   [] Palpitations   [] Shortness of breath when laying flat   [] Shortness of breath with exertion. Vascular:  [] Pain in legs with walking   [x] Pain in legs with standing  [] History of DVT   [] Phlebitis   [] Swelling in legs   [x] Varicose veins   [] Non-healing ulcers Pulmonary:   [] Uses home oxygen   [] Productive cough   [] Hemoptysis   [] Wheeze  [] COPD   [] Asthma Neurologic:  [] Dizziness   [] Seizures   [] History of stroke   [] History of TIA  [] Aphasia   [] Vissual changes   [] Weakness or numbness in arm   [] Weakness or numbness in leg Musculoskeletal:   [] Joint swelling   [] Joint pain   [] Low back pain Hematologic:  [] Easy bruising  [] Easy bleeding   [] Hypercoagulable state   [] Anemic Gastrointestinal:  [] Diarrhea   [] Vomiting  [] Gastroesophageal reflux/heartburn   [] Difficulty swallowing. Genitourinary:  [] Chronic kidney disease   [] Difficult urination  [] Frequent urination   [] Blood in urine Skin:  [] Rashes   [] Ulcers  Psychological:  [] History of anxiety   []  History of major depression.  Physical Examination  Vitals:   02/08/24 1407  BP: 111/65  Pulse: 63  Weight: 199 lb (90.3 kg)  Height: 5' 10 (1.778 m)   Body mass index is 28.55 kg/m. Gen: WD/WN, NAD Head: [place]/AT,  No temporalis wasting.  Ear/Nose/Throat: Hearing grossly intact, nares w/o erythema or drainage, pinna without lesions Eyes: PER, EOMI, sclera nonicteric.  Neck: Supple, no gross masses.  No JVD.  Pulmonary:  Good air movement, no audible wheezing, no use of accessory muscles.  Cardiac: RRR, precordium not hyperdynamic. Vascular:  Large varicosities present, greater than 10 mm ***.  Veins are tender to palpation  Mild venous stasis changes to the legs bilaterally.  Trace soft pitting edema CEAP C3sEpAsPr Vessel Right Left  Radial Palpable Palpable  Gastrointestinal: soft, non-distended. No guarding/no peritoneal signs.  Musculoskeletal: M/S 5/5 throughout.  No deformity.  Neurologic: CN 2-12  intact. Pain and light touch intact in extremities.  Symmetrical.  Speech is fluent. Motor exam as listed above. Psychiatric: Judgment intact, Mood & affect appropriate for pt's clinical situation. Dermatologic: Venous rashes no ulcers noted.  No changes consistent with cellulitis. Lymph : No lichenification or skin changes of chronic lymphedema.  CBC Lab Results  Component Value Date   WBC 9.2 01/05/2018   HGB 13.1 01/05/2018   HCT 38.0 (L) 01/05/2018   MCV 95.5 01/05/2018   PLT 143 (L) 01/05/2018    BMET    Component Value Date/Time   NA 137 01/05/2018 1605   K 3.5 01/05/2018 1605   CL 103 01/05/2018 1605   CO2 24 01/05/2018 1605   GLUCOSE 140 (H) 01/05/2018 1605   BUN 26 (H) 01/05/2018 1605   CREATININE 1.90 (H) 01/05/2018 1605   CALCIUM 9.1 01/05/2018 1605   GFRNONAA 33 (L) 01/05/2018 1605   GFRAA 38 (L) 01/05/2018 1605   CrCl cannot be calculated (Patient's most recent lab result is older than the maximum 21 days allowed.).  COAG No results found for: INR, PROTIME  Radiology VAS US  LOWER EXTREMITY VENOUS REFLUX Result Date: 02/08/2024  Lower Venous Reflux Study Patient Name:  Adrian Reyes [facility]  Date of Exam:   02/08/2024 Medical Rec #: [id number]            Accession #:    [id number] Date of Birth: [birth year]            Patient Gender: M Patient Age:   89 years Exam Location:  [facility] Procedure:      VAS US  LOWER EXTREMITY VENOUS REFLUX Referring Phys: ORVIN DARING --------------------------------------------------------------------------------  Indications: Pain, and varicosities.  Performing Technologist: Leafy Gibes RVS  Examination Guidelines: A complete evaluation includes B-mode imaging, spectral Doppler, color Doppler, and power Doppler as needed of all accessible portions of each vessel. Bilateral testing is considered an integral part of a complete examination. Limited examinations for reoccurring indications may be performed as noted. The  reflux portion of the exam is performed with the patient in reverse Trendelenburg. Significant venous reflux is defined as >500 ms in the superficial venous system, and >1 second in the deep venous system.  Venous Reflux Times +--------------+---------+------+-----------+------------+--------+ RIGHT         Reflux NoRefluxReflux TimeDiameter cmsComments                         Yes                                  +--------------+---------+------+-----------+------------+--------+ CFV           no                                             +--------------+---------+------+-----------+------------+--------+  FV prox       no                                             +--------------+---------+------+-----------+------------+--------+ FV mid        no                                             +--------------+---------+------+-----------+------------+--------+ FV dist       no                                             +--------------+---------+------+-----------+------------+--------+ Popliteal     no                                             +--------------+---------+------+-----------+------------+--------+ GSV at SFJ    no                            .54              +--------------+---------+------+-----------+------------+--------+ GSV prox thighno                            .55              +--------------+---------+------+-----------+------------+--------+ GSV mid thigh no                            .48              +--------------+---------+------+-----------+------------+--------+ GSV dist thighno                            .38              +--------------+---------+------+-----------+------------+--------+ GSV at knee   no                            .44              +--------------+---------+------+-----------+------------+--------+ GSV prox calf no                            .38               +--------------+---------+------+-----------+------------+--------+ SSV at [facility]    no                            .25              +--------------+---------+------+-----------+------------+--------+  +--------------+---------+------+-----------+------------+--------+ LEFT          Reflux NoRefluxReflux TimeDiameter cmsComments                         Yes                                  +--------------+---------+------+-----------+------------+--------+  CFV           no                                             +--------------+---------+------+-----------+------------+--------+ FV prox       no                                             +--------------+---------+------+-----------+------------+--------+ FV mid        no                                             +--------------+---------+------+-----------+------------+--------+ FV dist       no                                             +--------------+---------+------+-----------+------------+--------+ Popliteal     no                                             +--------------+---------+------+-----------+------------+--------+ GSV at SFJ    no                            .67              +--------------+---------+------+-----------+------------+--------+ GSV prox thigh          yes    1958 ms      .43              +--------------+---------+------+-----------+------------+--------+ GSV mid thigh           yes    1210 ms      .35              +--------------+---------+------+-----------+------------+--------+ GSV dist thigh          yes    675 ms       .37              +--------------+---------+------+-----------+------------+--------+ GSV at knee             yes    1049 ms      .40              +--------------+---------+------+-----------+------------+--------+ GSV prox calf           yes    1863 ms      .43               +--------------+---------+------+-----------+------------+--------+ SSV at [facility]    no                            .29              +--------------+---------+------+-----------+------------+--------+   Summary: Bilateral: - No evidence of deep vein thrombosis seen in the lower extremities, bilaterally, from the common femoral through the popliteal veins. - No evidence of deep venous insufficiency seen bilaterally  in the lower extremity. - No evidence of superficial venous reflux seen in the short saphenous veins bilaterally.  Right: - No evidence of superficial venous reflux seen in the right greater saphenous vein. - Known Rt GSV Calf Thrombophlebitis Ankle level to Proximal Calf area.  Left: - Venous reflux is noted in the left greater saphenous vein in the thigh. - Venous reflux is noted in the left greater saphenous vein in the calf.  *See table(s) above for measurements and observations. Electronically signed by Cordella Shawl MD on 02/08/2024 at 4:53:20 PM.    Final      Assessment/Plan There are no diagnoses linked to this encounter.   Cordella Shawl, MD  02/16/2024 2:51 PM

## 2024-02-17 ENCOUNTER — Encounter (INDEPENDENT_AMBULATORY_CARE_PROVIDER_SITE_OTHER): Payer: Self-pay | Admitting: Vascular Surgery

## 2024-02-17 DIAGNOSIS — I8310 Varicose veins of unspecified lower extremity with inflammation: Secondary | ICD-10-CM | POA: Insufficient documentation

## 2024-05-07 NOTE — Progress Notes (Unsigned)
 "               MRN : [id number]  Adrian Reyes is a 84 y.o. ([birth year]) male who presents with chief complaint of varicose veins hurt.  History of Present Illness:   The patient returns for followup evaluation 3 months after the initial visit. The patient continues to have pain in the lower extremities with dependency. The pain is lessened with elevation. Graduated compression stockings, Class I (20-30 mmHg), have been worn but the stockings do not eliminate the leg pain. Over-the-counter analgesics do not improve the symptoms. The degree of discomfort continues to interfere with daily activities. The patient notes the pain in the legs is causing problems with daily exercise, at the workplace and even with household activities and maintenance such as standing in the kitchen preparing meals and doing dishes.   Duplex ultrasound performed today demonstrates normal deep venous system bilaterally. There is no evidence of superficial reflux on the right. On the left there is extensive reflux throughout the great saphenous vein.   Active Medications[1]  Past Medical History:  Diagnosis Date   Arthritis    Renal disorder     Past Surgical History:  Procedure Laterality Date   JOINT REPLACEMENT      Social History Social History[2]  Family History Family History  Problem Relation Age of Onset   Heart disease Mother    Alcohol abuse Father     Allergies[3]   REVIEW OF SYSTEMS (Negative unless checked)  Constitutional: [] Weight loss  [] Fever  [] Chills Cardiac: [] Chest pain   [] Chest pressure   [] Palpitations   [] Shortness of breath when laying flat   [] Shortness of breath with exertion. Vascular:  [] Pain in legs with walking   [x] Pain in legs with standing  [] History of DVT   [] Phlebitis   [] Swelling in legs   [x] Varicose veins   [] Non-healing ulcers Pulmonary:   [] Uses home oxygen   [] Productive cough   [] Hemoptysis   [] Wheeze  [] COPD   [] Asthma Neurologic:  [] Dizziness    [] Seizures   [] History of stroke   [] History of TIA  [] Aphasia   [] Vissual changes   [] Weakness or numbness in arm   [] Weakness or numbness in leg Musculoskeletal:   [] Joint swelling   [] Joint pain   [] Low back pain Hematologic:  [] Easy bruising  [] Easy bleeding   [] Hypercoagulable state   [] Anemic Gastrointestinal:  [] Diarrhea   [] Vomiting  [] Gastroesophageal reflux/heartburn   [] Difficulty swallowing. Genitourinary:  [] Chronic kidney disease   [] Difficult urination  [] Frequent urination   [] Blood in urine Skin:  [] Rashes   [] Ulcers  Psychological:  [] History of anxiety   []  History of major depression.  Physical Examination  There were no vitals filed for this visit. There is no height or weight on file to calculate BMI. Gen: WD/WN, NAD Head: [place]/AT, No temporalis wasting.  Ear/Nose/Throat: Hearing grossly intact, nares w/o erythema or drainage, pinna without lesions Eyes: PER, EOMI, sclera nonicteric.  Neck: Supple, no gross masses.  No JVD.  Pulmonary:  Good air movement, no audible wheezing, no use of accessory muscles.  Cardiac: RRR, precordium not hyperdynamic. Vascular:  Large varicosities present, greater than 10 mm left leg.  Veins are tender to palpation  Mild venous stasis changes to the legs bilaterally.  Trace soft pitting edema CEAP C3sEpAsPr Vessel Right Left  Radial Palpable Palpable  Gastrointestinal: soft, non-distended. No guarding/no peritoneal signs.  Musculoskeletal: M/S 5/5 throughout.  No deformity.  Neurologic: CN 2-12 intact. Pain and  light touch intact in extremities.  Symmetrical.  Speech is fluent. Motor exam as listed above. Psychiatric: Judgment intact, Mood & affect appropriate for pt's clinical situation. Dermatologic: Venous rashes no ulcers noted.  No changes consistent with cellulitis. Lymph : No lichenification or skin changes of chronic lymphedema.  CBC Lab Results  Component Value Date   WBC 9.2 01/05/2018   HGB 13.1 01/05/2018   HCT 38.0 (L)  01/05/2018   MCV 95.5 01/05/2018   PLT 143 (L) 01/05/2018    BMET    Component Value Date/Time   NA 137 01/05/2018 1605   K 3.5 01/05/2018 1605   CL 103 01/05/2018 1605   CO2 24 01/05/2018 1605   GLUCOSE 140 (H) 01/05/2018 1605   BUN 26 (H) 01/05/2018 1605   CREATININE 1.90 (H) 01/05/2018 1605   CALCIUM 9.1 01/05/2018 1605   GFRNONAA 33 (L) 01/05/2018 1605   GFRAA 38 (L) 01/05/2018 1605   CrCl cannot be calculated (Patient's most recent lab result is older than the maximum 21 days allowed.).  COAG No results found for: INR, PROTIME  Radiology No results found.   Assessment/Plan 1. Varicose veins with inflammation (Primary) Recommend  I have reviewed my previous  discussion with the patient regarding  varicose veins and why they cause symptoms. Patient will continue  wearing graduated compression stockings class 1 on a daily basis, beginning first thing in the morning and removing them in the evening.  The patient is CEAP C3sEpAsPr.  The patient has been wearing compression for more than 12 weeks with no or little benefit.  The patient has been exercising daily for more than 12 weeks. The patient has been elevating and taking OTC pain medications for more than 12 weeks.  None of these have have eliminated the pain related to the varicose veins and venous reflux or the discomfort regarding venous congestion.    In addition, behavioral modification including elevation during the day was again discussed and this will continue.  The patient has utilized over the counter pain medications and has been exercising.  However, at this time conservative therapy has not alleviated the patient's symptoms of leg pain and swelling  Recommend: laser ablation of the left great saphenous vein to eliminate the symptoms of pain and swelling of the lower extremities caused by the severe superficial venous reflux disease.   2. Hypertension, benign Continue antihypertensive medications as  already ordered, these medications have been reviewed and there are no changes at this time.  3. Stage 3 chronic kidney disease, unspecified whether stage 3a or 3b CKD (HCC) The patient has advanced renal disease.  However, at the present time the patient is not yet on dialysis.  Avoid nephrotoxic medications and dehydration.  Further plans per nephrology  4. Elevated cholesterol Continue statin as ordered and reviewed, no changes at this time    Cordella Shawl, MD  05/07/2024 2:54 PM      [1]  No outpatient medications have been marked as taking for the 05/09/24 encounter (Appointment) with Shawl, Cordella MATSU, MD.  [2]  Social History Tobacco Use   Smoking status: Never   Smokeless tobacco: Never  Substance Use Topics   Alcohol use: Yes  [3]  Allergies Allergen Reactions   Shellfish Allergy Anaphylaxis    Severe swelling   "

## 2024-05-09 ENCOUNTER — Encounter (INDEPENDENT_AMBULATORY_CARE_PROVIDER_SITE_OTHER): Payer: Self-pay | Admitting: Vascular Surgery

## 2024-05-09 ENCOUNTER — Ambulatory Visit (INDEPENDENT_AMBULATORY_CARE_PROVIDER_SITE_OTHER): Admitting: Vascular Surgery

## 2024-05-09 VITALS — BP 114/67 | HR 60 | Resp 17 | Ht 69.0 in

## 2024-05-09 DIAGNOSIS — I8310 Varicose veins of unspecified lower extremity with inflammation: Secondary | ICD-10-CM | POA: Diagnosis not present

## 2024-05-09 DIAGNOSIS — N183 Chronic kidney disease, stage 3 unspecified: Secondary | ICD-10-CM | POA: Diagnosis not present

## 2024-05-09 DIAGNOSIS — E7800 Pure hypercholesterolemia, unspecified: Secondary | ICD-10-CM

## 2024-05-09 DIAGNOSIS — I10 Essential (primary) hypertension: Secondary | ICD-10-CM | POA: Diagnosis not present
